# Patient Record
Sex: Female | Born: 1935 | Race: White | Hispanic: No | Smoking: Never smoker
Health system: Southern US, Community
[De-identification: ages and names within clinical notes are randomized; demographics above are authoritative.]

## PROBLEM LIST (undated history)

## (undated) DIAGNOSIS — I82409 Acute embolism and thrombosis of unspecified deep veins of unspecified lower extremity: Secondary | ICD-10-CM

## (undated) DIAGNOSIS — J189 Pneumonia, unspecified organism: Secondary | ICD-10-CM

## (undated) DIAGNOSIS — Z9889 Other specified postprocedural states: Secondary | ICD-10-CM

## (undated) DIAGNOSIS — T8859XA Other complications of anesthesia, initial encounter: Secondary | ICD-10-CM

## (undated) DIAGNOSIS — D759 Disease of blood and blood-forming organs, unspecified: Secondary | ICD-10-CM

## (undated) DIAGNOSIS — R112 Nausea with vomiting, unspecified: Secondary | ICD-10-CM

## (undated) DIAGNOSIS — H409 Unspecified glaucoma: Secondary | ICD-10-CM

## (undated) DIAGNOSIS — K219 Gastro-esophageal reflux disease without esophagitis: Secondary | ICD-10-CM

## (undated) DIAGNOSIS — I10 Essential (primary) hypertension: Secondary | ICD-10-CM

## (undated) DIAGNOSIS — M1990 Unspecified osteoarthritis, unspecified site: Secondary | ICD-10-CM

## (undated) DIAGNOSIS — J984 Other disorders of lung: Secondary | ICD-10-CM

## (undated) DIAGNOSIS — E119 Type 2 diabetes mellitus without complications: Secondary | ICD-10-CM

## (undated) DIAGNOSIS — J8281 Chronic eosinophilic pneumonia: Secondary | ICD-10-CM

## (undated) DIAGNOSIS — T4145XA Adverse effect of unspecified anesthetic, initial encounter: Secondary | ICD-10-CM

## (undated) DIAGNOSIS — J82 Pulmonary eosinophilia, not elsewhere classified: Secondary | ICD-10-CM

## (undated) HISTORY — PX: EYE SURGERY: SHX253

## (undated) HISTORY — PX: TUBAL LIGATION: SHX77

---

## 2010-11-13 ENCOUNTER — Other Ambulatory Visit: Payer: Self-pay | Admitting: Neurology

## 2010-11-13 DIAGNOSIS — M25559 Pain in unspecified hip: Secondary | ICD-10-CM

## 2010-11-17 ENCOUNTER — Ambulatory Visit
Admission: RE | Admit: 2010-11-17 | Discharge: 2010-11-17 | Disposition: A | Discharge status: Home / Self Care | Payer: Medicare Other | Source: Ambulatory Visit | Attending: Neurology | Admitting: Neurology

## 2010-11-17 DIAGNOSIS — M25559 Pain in unspecified hip: Secondary | ICD-10-CM

## 2011-05-29 DIAGNOSIS — H4011X0 Primary open-angle glaucoma, stage unspecified: Secondary | ICD-10-CM | POA: Diagnosis not present

## 2011-06-12 DIAGNOSIS — R609 Edema, unspecified: Secondary | ICD-10-CM | POA: Diagnosis not present

## 2011-06-12 DIAGNOSIS — S8010XA Contusion of unspecified lower leg, initial encounter: Secondary | ICD-10-CM | POA: Diagnosis not present

## 2011-06-12 DIAGNOSIS — R0602 Shortness of breath: Secondary | ICD-10-CM | POA: Diagnosis not present

## 2011-06-13 DIAGNOSIS — I509 Heart failure, unspecified: Secondary | ICD-10-CM | POA: Diagnosis not present

## 2011-06-13 DIAGNOSIS — I369 Nonrheumatic tricuspid valve disorder, unspecified: Secondary | ICD-10-CM | POA: Diagnosis not present

## 2011-06-13 DIAGNOSIS — I059 Rheumatic mitral valve disease, unspecified: Secondary | ICD-10-CM | POA: Diagnosis not present

## 2011-06-13 DIAGNOSIS — I079 Rheumatic tricuspid valve disease, unspecified: Secondary | ICD-10-CM | POA: Diagnosis not present

## 2011-06-13 DIAGNOSIS — I517 Cardiomegaly: Secondary | ICD-10-CM | POA: Diagnosis not present

## 2011-06-13 DIAGNOSIS — I359 Nonrheumatic aortic valve disorder, unspecified: Secondary | ICD-10-CM | POA: Diagnosis not present

## 2011-08-15 DIAGNOSIS — R0989 Other specified symptoms and signs involving the circulatory and respiratory systems: Secondary | ICD-10-CM | POA: Diagnosis not present

## 2011-08-15 DIAGNOSIS — R7309 Other abnormal glucose: Secondary | ICD-10-CM | POA: Diagnosis not present

## 2011-08-15 DIAGNOSIS — M545 Low back pain: Secondary | ICD-10-CM | POA: Diagnosis not present

## 2011-08-20 DIAGNOSIS — R0989 Other specified symptoms and signs involving the circulatory and respiratory systems: Secondary | ICD-10-CM | POA: Diagnosis not present

## 2011-08-20 DIAGNOSIS — I658 Occlusion and stenosis of other precerebral arteries: Secondary | ICD-10-CM | POA: Diagnosis not present

## 2011-08-20 DIAGNOSIS — I6529 Occlusion and stenosis of unspecified carotid artery: Secondary | ICD-10-CM | POA: Diagnosis not present

## 2011-08-28 DIAGNOSIS — E782 Mixed hyperlipidemia: Secondary | ICD-10-CM | POA: Diagnosis not present

## 2011-08-28 DIAGNOSIS — E119 Type 2 diabetes mellitus without complications: Secondary | ICD-10-CM | POA: Diagnosis not present

## 2011-09-05 DIAGNOSIS — E119 Type 2 diabetes mellitus without complications: Secondary | ICD-10-CM | POA: Diagnosis not present

## 2011-09-09 DIAGNOSIS — E119 Type 2 diabetes mellitus without complications: Secondary | ICD-10-CM | POA: Diagnosis not present

## 2011-09-09 DIAGNOSIS — Z713 Dietary counseling and surveillance: Secondary | ICD-10-CM | POA: Diagnosis not present

## 2011-09-19 DIAGNOSIS — E119 Type 2 diabetes mellitus without complications: Secondary | ICD-10-CM | POA: Diagnosis not present

## 2011-09-19 DIAGNOSIS — Z713 Dietary counseling and surveillance: Secondary | ICD-10-CM | POA: Diagnosis not present

## 2011-11-28 DIAGNOSIS — I700 Atherosclerosis of aorta: Secondary | ICD-10-CM | POA: Diagnosis not present

## 2011-11-28 DIAGNOSIS — R7309 Other abnormal glucose: Secondary | ICD-10-CM | POA: Diagnosis not present

## 2011-11-28 DIAGNOSIS — G609 Hereditary and idiopathic neuropathy, unspecified: Secondary | ICD-10-CM | POA: Diagnosis not present

## 2011-11-28 DIAGNOSIS — E782 Mixed hyperlipidemia: Secondary | ICD-10-CM | POA: Diagnosis not present

## 2011-11-28 DIAGNOSIS — Z23 Encounter for immunization: Secondary | ICD-10-CM | POA: Diagnosis not present

## 2011-12-02 DIAGNOSIS — H524 Presbyopia: Secondary | ICD-10-CM | POA: Diagnosis not present

## 2011-12-02 DIAGNOSIS — H4011X0 Primary open-angle glaucoma, stage unspecified: Secondary | ICD-10-CM | POA: Diagnosis not present

## 2011-12-15 DIAGNOSIS — M5137 Other intervertebral disc degeneration, lumbosacral region: Secondary | ICD-10-CM | POA: Diagnosis not present

## 2011-12-15 DIAGNOSIS — IMO0002 Reserved for concepts with insufficient information to code with codable children: Secondary | ICD-10-CM | POA: Diagnosis not present

## 2011-12-15 DIAGNOSIS — M79609 Pain in unspecified limb: Secondary | ICD-10-CM | POA: Diagnosis not present

## 2011-12-19 DIAGNOSIS — M6281 Muscle weakness (generalized): Secondary | ICD-10-CM | POA: Diagnosis not present

## 2011-12-19 DIAGNOSIS — R262 Difficulty in walking, not elsewhere classified: Secondary | ICD-10-CM | POA: Diagnosis not present

## 2011-12-19 DIAGNOSIS — M545 Low back pain: Secondary | ICD-10-CM | POA: Diagnosis not present

## 2011-12-19 DIAGNOSIS — R293 Abnormal posture: Secondary | ICD-10-CM | POA: Diagnosis not present

## 2011-12-23 DIAGNOSIS — R293 Abnormal posture: Secondary | ICD-10-CM | POA: Diagnosis not present

## 2011-12-23 DIAGNOSIS — M6281 Muscle weakness (generalized): Secondary | ICD-10-CM | POA: Diagnosis not present

## 2011-12-23 DIAGNOSIS — R262 Difficulty in walking, not elsewhere classified: Secondary | ICD-10-CM | POA: Diagnosis not present

## 2011-12-23 DIAGNOSIS — M545 Low back pain: Secondary | ICD-10-CM | POA: Diagnosis not present

## 2011-12-25 DIAGNOSIS — M6281 Muscle weakness (generalized): Secondary | ICD-10-CM | POA: Diagnosis not present

## 2011-12-25 DIAGNOSIS — R262 Difficulty in walking, not elsewhere classified: Secondary | ICD-10-CM | POA: Diagnosis not present

## 2011-12-25 DIAGNOSIS — M545 Low back pain: Secondary | ICD-10-CM | POA: Diagnosis not present

## 2011-12-25 DIAGNOSIS — R293 Abnormal posture: Secondary | ICD-10-CM | POA: Diagnosis not present

## 2011-12-30 DIAGNOSIS — M6281 Muscle weakness (generalized): Secondary | ICD-10-CM | POA: Diagnosis not present

## 2011-12-30 DIAGNOSIS — R262 Difficulty in walking, not elsewhere classified: Secondary | ICD-10-CM | POA: Diagnosis not present

## 2011-12-30 DIAGNOSIS — M545 Low back pain: Secondary | ICD-10-CM | POA: Diagnosis not present

## 2011-12-30 DIAGNOSIS — R293 Abnormal posture: Secondary | ICD-10-CM | POA: Diagnosis not present

## 2012-01-01 DIAGNOSIS — M545 Low back pain: Secondary | ICD-10-CM | POA: Diagnosis not present

## 2012-01-01 DIAGNOSIS — M6281 Muscle weakness (generalized): Secondary | ICD-10-CM | POA: Diagnosis not present

## 2012-01-01 DIAGNOSIS — R262 Difficulty in walking, not elsewhere classified: Secondary | ICD-10-CM | POA: Diagnosis not present

## 2012-01-01 DIAGNOSIS — R293 Abnormal posture: Secondary | ICD-10-CM | POA: Diagnosis not present

## 2012-01-06 DIAGNOSIS — M545 Low back pain: Secondary | ICD-10-CM | POA: Diagnosis not present

## 2012-01-06 DIAGNOSIS — R262 Difficulty in walking, not elsewhere classified: Secondary | ICD-10-CM | POA: Diagnosis not present

## 2012-01-06 DIAGNOSIS — R293 Abnormal posture: Secondary | ICD-10-CM | POA: Diagnosis not present

## 2012-01-06 DIAGNOSIS — M6281 Muscle weakness (generalized): Secondary | ICD-10-CM | POA: Diagnosis not present

## 2012-01-08 DIAGNOSIS — R262 Difficulty in walking, not elsewhere classified: Secondary | ICD-10-CM | POA: Diagnosis not present

## 2012-01-08 DIAGNOSIS — R293 Abnormal posture: Secondary | ICD-10-CM | POA: Diagnosis not present

## 2012-01-08 DIAGNOSIS — M6281 Muscle weakness (generalized): Secondary | ICD-10-CM | POA: Diagnosis not present

## 2012-01-08 DIAGNOSIS — M545 Low back pain: Secondary | ICD-10-CM | POA: Diagnosis not present

## 2012-01-12 DIAGNOSIS — R262 Difficulty in walking, not elsewhere classified: Secondary | ICD-10-CM | POA: Diagnosis not present

## 2012-01-12 DIAGNOSIS — M6281 Muscle weakness (generalized): Secondary | ICD-10-CM | POA: Diagnosis not present

## 2012-01-12 DIAGNOSIS — R293 Abnormal posture: Secondary | ICD-10-CM | POA: Diagnosis not present

## 2012-01-12 DIAGNOSIS — M545 Low back pain: Secondary | ICD-10-CM | POA: Diagnosis not present

## 2012-01-15 DIAGNOSIS — M545 Low back pain: Secondary | ICD-10-CM | POA: Diagnosis not present

## 2012-01-15 DIAGNOSIS — M6281 Muscle weakness (generalized): Secondary | ICD-10-CM | POA: Diagnosis not present

## 2012-01-15 DIAGNOSIS — R293 Abnormal posture: Secondary | ICD-10-CM | POA: Diagnosis not present

## 2012-01-15 DIAGNOSIS — R262 Difficulty in walking, not elsewhere classified: Secondary | ICD-10-CM | POA: Diagnosis not present

## 2012-01-20 DIAGNOSIS — R293 Abnormal posture: Secondary | ICD-10-CM | POA: Diagnosis not present

## 2012-01-20 DIAGNOSIS — R262 Difficulty in walking, not elsewhere classified: Secondary | ICD-10-CM | POA: Diagnosis not present

## 2012-01-20 DIAGNOSIS — M545 Low back pain: Secondary | ICD-10-CM | POA: Diagnosis not present

## 2012-01-20 DIAGNOSIS — M6281 Muscle weakness (generalized): Secondary | ICD-10-CM | POA: Diagnosis not present

## 2012-01-22 DIAGNOSIS — R293 Abnormal posture: Secondary | ICD-10-CM | POA: Diagnosis not present

## 2012-01-22 DIAGNOSIS — R262 Difficulty in walking, not elsewhere classified: Secondary | ICD-10-CM | POA: Diagnosis not present

## 2012-01-22 DIAGNOSIS — M6281 Muscle weakness (generalized): Secondary | ICD-10-CM | POA: Diagnosis not present

## 2012-01-22 DIAGNOSIS — M545 Low back pain: Secondary | ICD-10-CM | POA: Diagnosis not present

## 2012-01-28 DIAGNOSIS — R262 Difficulty in walking, not elsewhere classified: Secondary | ICD-10-CM | POA: Diagnosis not present

## 2012-01-28 DIAGNOSIS — R293 Abnormal posture: Secondary | ICD-10-CM | POA: Diagnosis not present

## 2012-01-28 DIAGNOSIS — M545 Low back pain: Secondary | ICD-10-CM | POA: Diagnosis not present

## 2012-01-28 DIAGNOSIS — M6281 Muscle weakness (generalized): Secondary | ICD-10-CM | POA: Diagnosis not present

## 2012-01-30 DIAGNOSIS — M6281 Muscle weakness (generalized): Secondary | ICD-10-CM | POA: Diagnosis not present

## 2012-01-30 DIAGNOSIS — R293 Abnormal posture: Secondary | ICD-10-CM | POA: Diagnosis not present

## 2012-01-30 DIAGNOSIS — R262 Difficulty in walking, not elsewhere classified: Secondary | ICD-10-CM | POA: Diagnosis not present

## 2012-01-30 DIAGNOSIS — M545 Low back pain: Secondary | ICD-10-CM | POA: Diagnosis not present

## 2012-02-03 DIAGNOSIS — R293 Abnormal posture: Secondary | ICD-10-CM | POA: Diagnosis not present

## 2012-02-03 DIAGNOSIS — R262 Difficulty in walking, not elsewhere classified: Secondary | ICD-10-CM | POA: Diagnosis not present

## 2012-02-03 DIAGNOSIS — M6281 Muscle weakness (generalized): Secondary | ICD-10-CM | POA: Diagnosis not present

## 2012-02-03 DIAGNOSIS — M545 Low back pain: Secondary | ICD-10-CM | POA: Diagnosis not present

## 2012-02-06 DIAGNOSIS — M545 Low back pain: Secondary | ICD-10-CM | POA: Diagnosis not present

## 2012-02-06 DIAGNOSIS — M6281 Muscle weakness (generalized): Secondary | ICD-10-CM | POA: Diagnosis not present

## 2012-02-06 DIAGNOSIS — R262 Difficulty in walking, not elsewhere classified: Secondary | ICD-10-CM | POA: Diagnosis not present

## 2012-02-06 DIAGNOSIS — R293 Abnormal posture: Secondary | ICD-10-CM | POA: Diagnosis not present

## 2012-02-09 DIAGNOSIS — IMO0002 Reserved for concepts with insufficient information to code with codable children: Secondary | ICD-10-CM | POA: Diagnosis not present

## 2012-02-09 DIAGNOSIS — M5137 Other intervertebral disc degeneration, lumbosacral region: Secondary | ICD-10-CM | POA: Diagnosis not present

## 2012-02-09 DIAGNOSIS — M79609 Pain in unspecified limb: Secondary | ICD-10-CM | POA: Diagnosis not present

## 2012-03-15 DIAGNOSIS — E782 Mixed hyperlipidemia: Secondary | ICD-10-CM | POA: Diagnosis not present

## 2012-03-15 DIAGNOSIS — Z79899 Other long term (current) drug therapy: Secondary | ICD-10-CM | POA: Diagnosis not present

## 2012-03-15 DIAGNOSIS — E119 Type 2 diabetes mellitus without complications: Secondary | ICD-10-CM | POA: Diagnosis not present

## 2012-03-24 DIAGNOSIS — E1149 Type 2 diabetes mellitus with other diabetic neurological complication: Secondary | ICD-10-CM | POA: Diagnosis not present

## 2012-06-21 DIAGNOSIS — S61209A Unspecified open wound of unspecified finger without damage to nail, initial encounter: Secondary | ICD-10-CM | POA: Diagnosis not present

## 2012-07-02 DIAGNOSIS — Z0000 Encounter for general adult medical examination without abnormal findings: Secondary | ICD-10-CM | POA: Diagnosis not present

## 2012-07-02 DIAGNOSIS — IMO0002 Reserved for concepts with insufficient information to code with codable children: Secondary | ICD-10-CM | POA: Diagnosis not present

## 2012-07-02 DIAGNOSIS — G609 Hereditary and idiopathic neuropathy, unspecified: Secondary | ICD-10-CM | POA: Diagnosis not present

## 2012-08-12 DIAGNOSIS — M25559 Pain in unspecified hip: Secondary | ICD-10-CM | POA: Diagnosis not present

## 2012-09-15 DIAGNOSIS — E782 Mixed hyperlipidemia: Secondary | ICD-10-CM | POA: Diagnosis not present

## 2012-09-15 DIAGNOSIS — Z79899 Other long term (current) drug therapy: Secondary | ICD-10-CM | POA: Diagnosis not present

## 2012-09-15 DIAGNOSIS — E119 Type 2 diabetes mellitus without complications: Secondary | ICD-10-CM | POA: Diagnosis not present

## 2012-09-30 DIAGNOSIS — E119 Type 2 diabetes mellitus without complications: Secondary | ICD-10-CM | POA: Diagnosis not present

## 2012-09-30 DIAGNOSIS — I10 Essential (primary) hypertension: Secondary | ICD-10-CM | POA: Diagnosis not present

## 2012-09-30 DIAGNOSIS — E782 Mixed hyperlipidemia: Secondary | ICD-10-CM | POA: Diagnosis not present

## 2012-10-04 DIAGNOSIS — W57XXXA Bitten or stung by nonvenomous insect and other nonvenomous arthropods, initial encounter: Secondary | ICD-10-CM | POA: Diagnosis not present

## 2012-10-04 DIAGNOSIS — S30860A Insect bite (nonvenomous) of lower back and pelvis, initial encounter: Secondary | ICD-10-CM | POA: Diagnosis not present

## 2012-10-13 DIAGNOSIS — H4011X0 Primary open-angle glaucoma, stage unspecified: Secondary | ICD-10-CM | POA: Diagnosis not present

## 2012-12-16 DIAGNOSIS — Z23 Encounter for immunization: Secondary | ICD-10-CM | POA: Diagnosis not present

## 2013-03-18 DIAGNOSIS — E7800 Pure hypercholesterolemia, unspecified: Secondary | ICD-10-CM | POA: Diagnosis not present

## 2013-03-18 DIAGNOSIS — Z79899 Other long term (current) drug therapy: Secondary | ICD-10-CM | POA: Diagnosis not present

## 2013-03-18 DIAGNOSIS — E119 Type 2 diabetes mellitus without complications: Secondary | ICD-10-CM | POA: Diagnosis not present

## 2013-03-25 DIAGNOSIS — E119 Type 2 diabetes mellitus without complications: Secondary | ICD-10-CM | POA: Diagnosis not present

## 2013-03-25 DIAGNOSIS — M810 Age-related osteoporosis without current pathological fracture: Secondary | ICD-10-CM | POA: Diagnosis not present

## 2013-04-29 DIAGNOSIS — M810 Age-related osteoporosis without current pathological fracture: Secondary | ICD-10-CM | POA: Diagnosis not present

## 2013-04-29 DIAGNOSIS — M5126 Other intervertebral disc displacement, lumbar region: Secondary | ICD-10-CM | POA: Diagnosis not present

## 2013-05-12 DIAGNOSIS — M47817 Spondylosis without myelopathy or radiculopathy, lumbosacral region: Secondary | ICD-10-CM | POA: Diagnosis not present

## 2013-05-12 DIAGNOSIS — M4310 Spondylolisthesis, site unspecified: Secondary | ICD-10-CM | POA: Diagnosis not present

## 2013-05-12 DIAGNOSIS — M48061 Spinal stenosis, lumbar region without neurogenic claudication: Secondary | ICD-10-CM | POA: Diagnosis not present

## 2013-05-12 DIAGNOSIS — S32009A Unspecified fracture of unspecified lumbar vertebra, initial encounter for closed fracture: Secondary | ICD-10-CM | POA: Diagnosis not present

## 2013-06-07 DIAGNOSIS — I10 Essential (primary) hypertension: Secondary | ICD-10-CM | POA: Diagnosis not present

## 2013-06-07 DIAGNOSIS — Z6832 Body mass index (BMI) 32.0-32.9, adult: Secondary | ICD-10-CM | POA: Diagnosis not present

## 2013-06-07 DIAGNOSIS — M48061 Spinal stenosis, lumbar region without neurogenic claudication: Secondary | ICD-10-CM | POA: Diagnosis not present

## 2013-06-07 DIAGNOSIS — M549 Dorsalgia, unspecified: Secondary | ICD-10-CM | POA: Diagnosis not present

## 2013-06-24 DIAGNOSIS — H4011X0 Primary open-angle glaucoma, stage unspecified: Secondary | ICD-10-CM | POA: Diagnosis not present

## 2013-07-27 ENCOUNTER — Other Ambulatory Visit: Payer: Self-pay | Admitting: Neurosurgery

## 2013-07-27 DIAGNOSIS — Q762 Congenital spondylolisthesis: Secondary | ICD-10-CM | POA: Diagnosis not present

## 2013-07-27 DIAGNOSIS — Z6832 Body mass index (BMI) 32.0-32.9, adult: Secondary | ICD-10-CM | POA: Diagnosis not present

## 2013-07-27 DIAGNOSIS — S32009A Unspecified fracture of unspecified lumbar vertebra, initial encounter for closed fracture: Secondary | ICD-10-CM | POA: Diagnosis not present

## 2013-07-27 DIAGNOSIS — M48061 Spinal stenosis, lumbar region without neurogenic claudication: Secondary | ICD-10-CM | POA: Diagnosis not present

## 2013-08-17 ENCOUNTER — Encounter (HOSPITAL_COMMUNITY): Payer: Self-pay | Admitting: Pharmacy Technician

## 2013-08-17 NOTE — Pre-Procedure Instructions (Signed)
Monica Evans  08/17/2013   Your procedure is scheduled on:  08/29/2013  Report to [facility]     ENTRANCE A at 5:30 AM.  Call this number if you have problems the morning of surgery: [phone]   Remember:   Do not eat food or drink liquids after midnight.  On SUNDAY   Take these medicines the morning of surgery with A SIP OF WATER:  Prednisone, omeprazole,  Pain medicine is OK if needed    Do not wear jewelry, make-up or nail polish.   Do not wear lotions, powders, or perfumes. You may wear deodorant.   Do not shave 48 hours prior to surgery.    Do not bring valuables to the hospital.  [facility] is not responsible for any belongings or valuables.                Contacts, dentures or bridgework may not be worn into surgery.   Leave suitcase in the car. After surgery it may be brought to your room.   For patients admitted to the hospital, discharge time is determined by your                treatment team.               Patients discharged the day of surgery will not be allowed to drive  home.  Name and phone number of your driver: with family  Special Instructions: Special Instructions: [place] - Preparing for Surgery  Before surgery, you can play an important role.  Because skin is not sterile, your skin needs to be as free of germs as possible.  You can reduce the number of germs on you skin by washing with CHG (chlorahexidine gluconate) soap before surgery.  CHG is an antiseptic cleaner which kills germs and bonds with the skin to continue killing germs even after washing.  Please DO NOT use if you have an allergy to CHG or antibacterial soaps.  If your skin becomes reddened/irritated stop using the CHG and inform your nurse when you arrive at Short Stay.  Do not shave (including legs and underarms) for at least 48 hours prior to the first CHG shower.  You may shave your face.  Please follow these instructions carefully:   1.  Shower with CHG  Soap the night before surgery and the  morning of Surgery.  2.  If you choose to wash your hair, wash your hair first as usual with your  normal shampoo.  3.  After you shampoo, rinse your hair and body thoroughly to remove the  Shampoo.  4.  Use CHG as you would any other liquid soap.  You can apply chg directly to the skin and wash gently with scrungie or a clean washcloth.  5.  Apply the CHG Soap to your body ONLY FROM THE NECK DOWN.    Do not use on open wounds or open sores.  Avoid contact with your eyes, ears, mouth and genitals (private parts).  Wash genitals (private parts)   with your normal soap.  6.  Wash thoroughly, paying special attention to the area where your surgery will be performed.  7.  Thoroughly rinse your body with warm water from the neck down.  8.  DO NOT shower/wash with your normal soap after using and rinsing off   the CHG Soap.  9.  Pat yourself dry with a clean towel.  10.  Wear clean pajamas.            11.  Place clean sheets on your bed the night of your first shower and do not sleep with pets.  Day of Surgery  Do not apply any lotions/deodorants the morning of surgery.  Please wear clean clothes to the hospital/surgery center.   Please read over the following fact sheets that you were given: Pain Booklet, Coughing and Deep Breathing, Blood Transfusion Information, MRSA Information and Surgical Site Infection Prevention

## 2013-08-18 ENCOUNTER — Ambulatory Visit (HOSPITAL_COMMUNITY)
Admission: RE | Admit: 2013-08-18 | Discharge: 2013-08-18 | Disposition: A | Discharge status: Home / Self Care | Payer: Medicare Other | Source: Ambulatory Visit | Attending: Neurosurgery | Admitting: Neurosurgery

## 2013-08-18 ENCOUNTER — Encounter (HOSPITAL_COMMUNITY)
Admission: RE | Admit: 2013-08-18 | Discharge: 2013-08-18 | Disposition: A | Discharge status: Home / Self Care | Payer: Medicare Other | Source: Ambulatory Visit | Attending: Neurosurgery | Admitting: Neurosurgery

## 2013-08-18 ENCOUNTER — Encounter (HOSPITAL_COMMUNITY): Payer: Self-pay

## 2013-08-18 DIAGNOSIS — Z01812 Encounter for preprocedural laboratory examination: Secondary | ICD-10-CM | POA: Insufficient documentation

## 2013-08-18 DIAGNOSIS — Z01810 Encounter for preprocedural cardiovascular examination: Secondary | ICD-10-CM | POA: Diagnosis not present

## 2013-08-18 DIAGNOSIS — Z01818 Encounter for other preprocedural examination: Secondary | ICD-10-CM | POA: Insufficient documentation

## 2013-08-18 HISTORY — DX: Adverse effect of unspecified anesthetic, initial encounter: T41.45XA

## 2013-08-18 HISTORY — DX: Pneumonia, unspecified organism: J18.9

## 2013-08-18 HISTORY — DX: Other specified postprocedural states: Z98.890

## 2013-08-18 HISTORY — DX: Essential (primary) hypertension: I10

## 2013-08-18 HISTORY — DX: Nausea with vomiting, unspecified: R11.2

## 2013-08-18 HISTORY — DX: Chronic eosinophilic pneumonia: J82.81

## 2013-08-18 HISTORY — DX: Disease of blood and blood-forming organs, unspecified: D75.9

## 2013-08-18 HISTORY — DX: Gastro-esophageal reflux disease without esophagitis: K21.9

## 2013-08-18 HISTORY — DX: Unspecified osteoarthritis, unspecified site: M19.90

## 2013-08-18 HISTORY — DX: Pulmonary eosinophilia, not elsewhere classified: J82

## 2013-08-18 HISTORY — DX: Other disorders of lung: J98.4

## 2013-08-18 HISTORY — DX: Unspecified glaucoma: H40.9

## 2013-08-18 HISTORY — DX: Other complications of anesthesia, initial encounter: T88.59XA

## 2013-08-18 HISTORY — DX: Type 2 diabetes mellitus without complications: E11.9

## 2013-08-18 LAB — BASIC METABOLIC PANEL
BUN: 20 mg/dL (ref 6–23)
CO2: 31 mEq/L (ref 19–32)
Calcium: 9.5 mg/dL (ref 8.4–10.5)
Chloride: 100 mEq/L (ref 96–112)
Creatinine, Ser: 1.14 mg/dL — ABNORMAL HIGH (ref 0.50–1.10)
GFR calc Af Amer: 52 mL/min — ABNORMAL LOW (ref >90)
GFR, EST NON AFRICAN AMERICAN: 45 mL/min — AB (ref >90)
Glucose, Bld: 146 mg/dL — ABNORMAL HIGH (ref 70–99)
Potassium: 4.8 mEq/L (ref 3.7–5.3)
Sodium: 141 mEq/L (ref 137–147)

## 2013-08-18 LAB — TYPE AND SCREEN
ABO/RH(D): O POS
Antibody Screen: NEGATIVE

## 2013-08-18 LAB — SURGICAL PCR SCREEN
MRSA, PCR: NEGATIVE
Staphylococcus aureus: NEGATIVE

## 2013-08-18 LAB — CBC
HCT: 41.8 % (ref 36.0–46.0)
Hemoglobin: 13.6 g/dL (ref 12.0–15.0)
MCH: 33.7 pg (ref 26.0–34.0)
MCHC: 32.5 g/dL (ref 30.0–36.0)
MCV: 103.7 fL — ABNORMAL HIGH (ref 78.0–100.0)
PLATELETS: 285 10*3/uL (ref 150–400)
RBC: 4.03 MIL/uL (ref 3.87–5.11)
RDW: 13.3 % (ref 11.5–15.5)
WBC: 13.4 10*3/uL — ABNORMAL HIGH (ref 4.0–10.5)

## 2013-08-18 LAB — ABO/RH: ABO/RH(D): O POS

## 2013-08-18 NOTE — Progress Notes (Signed)
Due to machine interpretation on EKG, requested EKG from PCP office.

## 2013-08-18 NOTE — Progress Notes (Signed)
Stress test done > 27yrs. Ago at [facility] told that it was normal. Pt. also had EKG & CXR at some point during 2014- /w PCP , Dr. Nicki Reaper - [facility].

## 2013-08-19 ENCOUNTER — Other Ambulatory Visit (HOSPITAL_COMMUNITY): Payer: No Typology Code available for payment source

## 2013-08-19 NOTE — Progress Notes (Signed)
Anesthesia Chart Review:  Patient is a 79 year old female scheduled for L3-4, L4-5 PLIF on 08/29/13 by Dr. Arnoldo Morale.   History includes pre-DM2 (no medications), HTN, GERD, eosinophilic PNA with apical lung scarring at age 56, glaucoma, arthritis, post-operative N/V. BMI is consistent with obesity. Smoking status was not documented at her PAT visit.   PCP is Dr. Nicki Reaper with [facility].    CXR on 08/18/13 showed: Probable chronic mild interstitial prominence without convincing pulmonary edema. Streaky left base retrocardiac atelectasis or infiltrate. Prior vertebroplasty at L2 level.       Preoperative labs noted.  EKG on 08/18/13 showed NSR, cannot rule out anterior infarct (age undetermined). Currently there is no comparison EKG in Muse or Epic. A comparison EKG was requested from [place] and [facility] as available.  Short Stay RN staff to follow-up and have one of the anesthesiologists review if received.  Otherwise, clinical correlation on the day of surgery. No CV symptoms were documented at her PAT visit.  George Hugh [facility] Short Stay Center/Anesthesiology Phone [phone] 08/19/2013 6:56 PM

## 2013-08-23 NOTE — Progress Notes (Signed)
Anesthesiology Chart Review:  ECG, Chest Xray  Reviewed both appear acceptable for surgery. Patient had (-) nuclear stress test 08/17/2001 and moderate LVH but normal LV systolic function by ECHO on 06/13/2011.   She will be evaluated by her assigned anesthesiologist on the day of surgery.  Roberts Gaudy

## 2013-08-28 MED ORDER — CEFAZOLIN SODIUM-DEXTROSE 2-3 GM-% IV SOLR
2.0000 g | INTRAVENOUS | Status: AC
Start: 2013-08-29 — End: 2013-08-29
  Administered 2013-08-29: 2 g via INTRAVENOUS
  Filled 2013-08-28: qty 50

## 2013-08-29 ENCOUNTER — Inpatient Hospital Stay (HOSPITAL_COMMUNITY): Payer: Medicare Other | Admitting: Anesthesiology

## 2013-08-29 ENCOUNTER — Inpatient Hospital Stay (HOSPITAL_COMMUNITY)
Admission: RE | Admit: 2013-08-29 | Discharge: 2013-09-02 | DRG: 460 | Disposition: A | Discharge status: Skilled Nursing Facility | Payer: Medicare Other | Source: Ambulatory Visit | Attending: Neurosurgery | Admitting: Neurosurgery

## 2013-08-29 ENCOUNTER — Encounter (HOSPITAL_COMMUNITY): Payer: Medicare Other | Admitting: Vascular Surgery

## 2013-08-29 ENCOUNTER — Inpatient Hospital Stay (HOSPITAL_COMMUNITY): Payer: Medicare Other

## 2013-08-29 ENCOUNTER — Encounter (HOSPITAL_COMMUNITY): Payer: Self-pay | Admitting: *Deleted

## 2013-08-29 ENCOUNTER — Encounter (HOSPITAL_COMMUNITY)
Admission: RE | Disposition: A | Discharge status: Skilled Nursing Facility | Payer: No Typology Code available for payment source | Source: Ambulatory Visit | Attending: Neurosurgery

## 2013-08-29 DIAGNOSIS — M48061 Spinal stenosis, lumbar region without neurogenic claudication: Secondary | ICD-10-CM | POA: Diagnosis not present

## 2013-08-29 DIAGNOSIS — Z79899 Other long term (current) drug therapy: Secondary | ICD-10-CM | POA: Diagnosis not present

## 2013-08-29 DIAGNOSIS — Q7649 Other congenital malformations of spine, not associated with scoliosis: Secondary | ICD-10-CM | POA: Diagnosis not present

## 2013-08-29 DIAGNOSIS — Z880 Allergy status to penicillin: Secondary | ICD-10-CM

## 2013-08-29 DIAGNOSIS — IMO0002 Reserved for concepts with insufficient information to code with codable children: Secondary | ICD-10-CM | POA: Diagnosis not present

## 2013-08-29 DIAGNOSIS — M545 Low back pain, unspecified: Secondary | ICD-10-CM | POA: Diagnosis not present

## 2013-08-29 DIAGNOSIS — M48062 Spinal stenosis, lumbar region with neurogenic claudication: Secondary | ICD-10-CM | POA: Diagnosis not present

## 2013-08-29 DIAGNOSIS — M4310 Spondylolisthesis, site unspecified: Secondary | ICD-10-CM | POA: Diagnosis not present

## 2013-08-29 DIAGNOSIS — S32009A Unspecified fracture of unspecified lumbar vertebra, initial encounter for closed fracture: Secondary | ICD-10-CM | POA: Diagnosis not present

## 2013-08-29 DIAGNOSIS — E119 Type 2 diabetes mellitus without complications: Secondary | ICD-10-CM | POA: Diagnosis present

## 2013-08-29 DIAGNOSIS — Z7982 Long term (current) use of aspirin: Secondary | ICD-10-CM

## 2013-08-29 DIAGNOSIS — K219 Gastro-esophageal reflux disease without esophagitis: Secondary | ICD-10-CM | POA: Diagnosis present

## 2013-08-29 DIAGNOSIS — Z9851 Tubal ligation status: Secondary | ICD-10-CM

## 2013-08-29 DIAGNOSIS — S32001A Stable burst fracture of unspecified lumbar vertebra, initial encounter for closed fracture: Secondary | ICD-10-CM

## 2013-08-29 DIAGNOSIS — I10 Essential (primary) hypertension: Secondary | ICD-10-CM | POA: Diagnosis present

## 2013-08-29 DIAGNOSIS — X58XXXA Exposure to other specified factors, initial encounter: Secondary | ICD-10-CM | POA: Diagnosis present

## 2013-08-29 DIAGNOSIS — M4800 Spinal stenosis, site unspecified: Secondary | ICD-10-CM | POA: Diagnosis not present

## 2013-08-29 DIAGNOSIS — M539 Dorsopathy, unspecified: Secondary | ICD-10-CM | POA: Diagnosis not present

## 2013-08-29 DIAGNOSIS — H409 Unspecified glaucoma: Secondary | ICD-10-CM | POA: Diagnosis present

## 2013-08-29 DIAGNOSIS — Q762 Congenital spondylolisthesis: Secondary | ICD-10-CM | POA: Diagnosis not present

## 2013-08-29 DIAGNOSIS — S32001K Stable burst fracture of unspecified lumbar vertebra, subsequent encounter for fracture with nonunion: Secondary | ICD-10-CM

## 2013-08-29 HISTORY — PX: POSTERIOR FUSION LUMBAR SPINE: SUR632

## 2013-08-29 SURGERY — POSTERIOR LUMBAR FUSION 2 LEVEL
Anesthesia: General

## 2013-08-29 MED ORDER — BUPIVACAINE LIPOSOME 1.3 % IJ SUSP
INTRAMUSCULAR | Status: DC | PRN
  Administered 2013-08-29: 20 mL

## 2013-08-29 MED ORDER — GLYCOPYRROLATE 0.2 MG/ML IJ SOLN
INTRAMUSCULAR | Status: DC | PRN
  Administered 2013-08-29: 0.4 mg via INTRAVENOUS

## 2013-08-29 MED ORDER — FENTANYL CITRATE 0.05 MG/ML IJ SOLN
INTRAMUSCULAR | Status: DC | PRN
  Administered 2013-08-29 (×2): 50 ug via INTRAVENOUS
  Administered 2013-08-29: 150 ug via INTRAVENOUS

## 2013-08-29 MED ORDER — THROMBIN 20000 UNITS EX SOLR
CUTANEOUS | Status: DC | PRN
  Administered 2013-08-29: 09:00:00 via TOPICAL

## 2013-08-29 MED ORDER — EPHEDRINE SULFATE 50 MG/ML IJ SOLN
INTRAMUSCULAR | Status: AC
  Filled 2013-08-29: qty 1

## 2013-08-29 MED ORDER — HYDROCORTISONE NA SUCCINATE PF 100 MG IJ SOLR
50.0000 mg | Freq: Four times a day (QID) | INTRAMUSCULAR | Status: AC
  Administered 2013-08-29 – 2013-08-31 (×8): 50 mg via INTRAVENOUS
  Filled 2013-08-29: qty 1
  Filled 2013-08-29 (×2): qty 2
  Filled 2013-08-29: qty 1
  Filled 2013-08-29: qty 2
  Filled 2013-08-29 (×4): qty 1
  Filled 2013-08-29: qty 2

## 2013-08-29 MED ORDER — MIDAZOLAM HCL 2 MG/2ML IJ SOLN
INTRAMUSCULAR | Status: AC
  Filled 2013-08-29: qty 2

## 2013-08-29 MED ORDER — ONDANSETRON HCL 4 MG/2ML IJ SOLN
4.0000 mg | INTRAMUSCULAR | Status: DC | PRN
  Administered 2013-08-29: 4 mg via INTRAVENOUS
  Filled 2013-08-29: qty 2

## 2013-08-29 MED ORDER — VERAPAMIL HCL ER 240 MG PO TBCR
240.0000 mg | EXTENDED_RELEASE_TABLET | Freq: Every day | ORAL | Status: DC
  Administered 2013-08-30 – 2013-09-01 (×3): 240 mg via ORAL
  Filled 2013-08-29 (×5): qty 1

## 2013-08-29 MED ORDER — BACITRACIN ZINC 500 UNIT/GM EX OINT
TOPICAL_OINTMENT | CUTANEOUS | Status: DC | PRN
  Administered 2013-08-29: 1 via TOPICAL

## 2013-08-29 MED ORDER — NEOSTIGMINE METHYLSULFATE 10 MG/10ML IV SOLN
INTRAVENOUS | Status: DC | PRN
  Administered 2013-08-29: 3 mg via INTRAVENOUS

## 2013-08-29 MED ORDER — DIAZEPAM 5 MG PO TABS
5.0000 mg | ORAL_TABLET | Freq: Four times a day (QID) | ORAL | Status: DC | PRN
  Administered 2013-08-29 – 2013-09-02 (×11): 5 mg via ORAL
  Filled 2013-08-29 (×11): qty 1

## 2013-08-29 MED ORDER — PROPOFOL 10 MG/ML IV BOLUS
INTRAVENOUS | Status: DC | PRN
  Administered 2013-08-29: 130 mg via INTRAVENOUS

## 2013-08-29 MED ORDER — FENTANYL CITRATE 0.05 MG/ML IJ SOLN
25.0000 ug | INTRAMUSCULAR | Status: DC | PRN
  Administered 2013-08-29: 25 ug via INTRAVENOUS
  Administered 2013-08-29: 50 ug via INTRAVENOUS
  Administered 2013-08-29: 25 ug via INTRAVENOUS

## 2013-08-29 MED ORDER — TIMOLOL MALEATE 0.5 % OP SOLN
1.0000 [drp] | Freq: Two times a day (BID) | OPHTHALMIC | Status: DC
  Administered 2013-08-29 – 2013-09-02 (×8): 1 [drp] via OPHTHALMIC
  Filled 2013-08-29: qty 5

## 2013-08-29 MED ORDER — SUCCINYLCHOLINE CHLORIDE 20 MG/ML IJ SOLN
INTRAMUSCULAR | Status: AC
  Filled 2013-08-29: qty 1

## 2013-08-29 MED ORDER — HYDROCODONE-ACETAMINOPHEN 5-325 MG PO TABS
1.0000 | ORAL_TABLET | ORAL | Status: DC | PRN
  Administered 2013-08-30: 1 via ORAL
  Administered 2013-08-30: 2 via ORAL
  Administered 2013-08-30 (×2): 1 via ORAL
  Administered 2013-08-31: 2 via ORAL
  Administered 2013-08-31 (×2): 1 via ORAL
  Administered 2013-09-01 – 2013-09-02 (×6): 2 via ORAL
  Filled 2013-08-29: qty 1
  Filled 2013-08-29: qty 2
  Filled 2013-08-29: qty 1
  Filled 2013-08-29 (×4): qty 2
  Filled 2013-08-29: qty 1
  Filled 2013-08-29: qty 2
  Filled 2013-08-29: qty 1
  Filled 2013-08-29 (×3): qty 2

## 2013-08-29 MED ORDER — CEFAZOLIN SODIUM-DEXTROSE 2-3 GM-% IV SOLR
2.0000 g | Freq: Three times a day (TID) | INTRAVENOUS | Status: AC
  Administered 2013-08-29 – 2013-08-30 (×2): 2 g via INTRAVENOUS
  Filled 2013-08-29 (×2): qty 50

## 2013-08-29 MED ORDER — FENTANYL CITRATE 0.05 MG/ML IJ SOLN
INTRAMUSCULAR | Status: AC
  Filled 2013-08-29: qty 2

## 2013-08-29 MED ORDER — FENTANYL CITRATE 0.05 MG/ML IJ SOLN
INTRAMUSCULAR | Status: AC
  Filled 2013-08-29: qty 5

## 2013-08-29 MED ORDER — DOCUSATE SODIUM 100 MG PO CAPS
100.0000 mg | ORAL_CAPSULE | Freq: Two times a day (BID) | ORAL | Status: DC
  Administered 2013-08-29 – 2013-09-02 (×8): 100 mg via ORAL
  Filled 2013-08-29 (×6): qty 1

## 2013-08-29 MED ORDER — SODIUM CHLORIDE 0.9 % IJ SOLN
INTRAMUSCULAR | Status: AC
  Filled 2013-08-29: qty 10

## 2013-08-29 MED ORDER — GLYCOPYRROLATE 0.2 MG/ML IJ SOLN
INTRAMUSCULAR | Status: AC
  Filled 2013-08-29: qty 2

## 2013-08-29 MED ORDER — LACTATED RINGERS IV SOLN
INTRAVENOUS | Status: DC
  Administered 2013-08-29 – 2013-08-30 (×2): via INTRAVENOUS

## 2013-08-29 MED ORDER — SODIUM CHLORIDE 0.9 % IV SOLN
10.0000 mg | INTRAVENOUS | Status: DC | PRN
  Administered 2013-08-29: 10 ug/min via INTRAVENOUS

## 2013-08-29 MED ORDER — LIDOCAINE HCL (CARDIAC) 20 MG/ML IV SOLN
INTRAVENOUS | Status: DC | PRN
  Administered 2013-08-29: 60 mg via INTRAVENOUS

## 2013-08-29 MED ORDER — NEOSTIGMINE METHYLSULFATE 10 MG/10ML IV SOLN
INTRAVENOUS | Status: AC
  Filled 2013-08-29: qty 1

## 2013-08-29 MED ORDER — 0.9 % SODIUM CHLORIDE (POUR BTL) OPTIME
TOPICAL | Status: DC | PRN
  Administered 2013-08-29: 1000 mL

## 2013-08-29 MED ORDER — ARTIFICIAL TEARS OP OINT
TOPICAL_OINTMENT | OPHTHALMIC | Status: AC
  Filled 2013-08-29: qty 3.5

## 2013-08-29 MED ORDER — DEXAMETHASONE SODIUM PHOSPHATE 4 MG/ML IJ SOLN
INTRAMUSCULAR | Status: DC | PRN
  Administered 2013-08-29: 4 mg via INTRAVENOUS

## 2013-08-29 MED ORDER — ONDANSETRON HCL 4 MG/2ML IJ SOLN
INTRAMUSCULAR | Status: AC
  Filled 2013-08-29: qty 2

## 2013-08-29 MED ORDER — ALUM & MAG HYDROXIDE-SIMETH 200-200-20 MG/5ML PO SUSP
30.0000 mL | Freq: Four times a day (QID) | ORAL | Status: DC | PRN
Start: 2013-08-29 — End: 2013-09-02

## 2013-08-29 MED ORDER — BUPIVACAINE LIPOSOME 1.3 % IJ SUSP
20.0000 mL | Freq: Once | INTRAMUSCULAR | Status: AC
  Filled 2013-08-29: qty 20

## 2013-08-29 MED ORDER — PREDNISONE 10 MG PO TABS
10.0000 mg | ORAL_TABLET | Freq: Every day | ORAL | Status: DC
  Administered 2013-08-31 – 2013-09-02 (×3): 10 mg via ORAL
  Filled 2013-08-29 (×4): qty 1
  Filled 2013-08-29: qty 2

## 2013-08-29 MED ORDER — LACTATED RINGERS IV SOLN
INTRAVENOUS | Status: DC | PRN
  Administered 2013-08-29 (×2): via INTRAVENOUS

## 2013-08-29 MED ORDER — TIMOLOL HEMIHYDRATE 0.5 % OP SOLN: 1.0000 [drp] | Freq: Two times a day (BID) | OPHTHALMIC | Status: DC

## 2013-08-29 MED ORDER — ROCURONIUM BROMIDE 100 MG/10ML IV SOLN
INTRAVENOUS | Status: DC | PRN
  Administered 2013-08-29: 50 mg via INTRAVENOUS

## 2013-08-29 MED ORDER — MENTHOL 3 MG MT LOZG: 1.0000 | LOZENGE | OROMUCOSAL | Status: DC | PRN

## 2013-08-29 MED ORDER — PROPOFOL 10 MG/ML IV BOLUS
INTRAVENOUS | Status: AC
  Filled 2013-08-29: qty 20

## 2013-08-29 MED ORDER — ACETAMINOPHEN 325 MG PO TABS
650.0000 mg | ORAL_TABLET | ORAL | Status: DC | PRN
  Filled 2013-08-29: qty 2

## 2013-08-29 MED ORDER — EPHEDRINE SULFATE 50 MG/ML IJ SOLN
INTRAMUSCULAR | Status: DC | PRN
  Administered 2013-08-29 (×2): 10 mg via INTRAVENOUS

## 2013-08-29 MED ORDER — LIDOCAINE HCL (CARDIAC) 20 MG/ML IV SOLN
INTRAVENOUS | Status: AC
  Filled 2013-08-29: qty 5

## 2013-08-29 MED ORDER — FUROSEMIDE 80 MG PO TABS
80.0000 mg | ORAL_TABLET | Freq: Every evening | ORAL | Status: DC
  Administered 2013-08-29: 80 mg via ORAL
  Filled 2013-08-29 (×2): qty 1

## 2013-08-29 MED ORDER — MORPHINE SULFATE 2 MG/ML IJ SOLN
1.0000 mg | INTRAMUSCULAR | Status: DC | PRN
  Administered 2013-08-29: 4 mg via INTRAVENOUS
  Administered 2013-08-31 – 2013-09-01 (×5): 2 mg via INTRAVENOUS
  Administered 2013-09-02: 4 mg via INTRAVENOUS
  Filled 2013-08-29 (×3): qty 1
  Filled 2013-08-29: qty 2
  Filled 2013-08-29: qty 1
  Filled 2013-08-29: qty 2
  Filled 2013-08-29: qty 1

## 2013-08-29 MED ORDER — PANTOPRAZOLE SODIUM 40 MG PO TBEC
40.0000 mg | DELAYED_RELEASE_TABLET | Freq: Every day | ORAL | Status: DC
  Administered 2013-08-30 – 2013-09-02 (×4): 40 mg via ORAL
  Filled 2013-08-29 (×3): qty 1

## 2013-08-29 MED ORDER — POTASSIUM CHLORIDE CRYS ER 10 MEQ PO TBCR
10.0000 meq | EXTENDED_RELEASE_TABLET | Freq: Every day | ORAL | Status: DC
  Administered 2013-08-30 – 2013-09-02 (×4): 10 meq via ORAL
  Filled 2013-08-29 (×6): qty 1

## 2013-08-29 MED ORDER — ROCURONIUM BROMIDE 50 MG/5ML IV SOLN
INTRAVENOUS | Status: AC
  Filled 2013-08-29: qty 1

## 2013-08-29 MED ORDER — ONDANSETRON HCL 4 MG/2ML IJ SOLN
INTRAMUSCULAR | Status: DC | PRN
  Administered 2013-08-29: 4 mg via INTRAVENOUS

## 2013-08-29 MED ORDER — OXYCODONE-ACETAMINOPHEN 5-325 MG PO TABS
1.0000 | ORAL_TABLET | ORAL | Status: DC | PRN
  Administered 2013-08-29 – 2013-09-02 (×9): 2 via ORAL
  Filled 2013-08-29 (×9): qty 2

## 2013-08-29 MED ORDER — PHENOL 1.4 % MT LIQD: 1.0000 | OROMUCOSAL | Status: DC | PRN

## 2013-08-29 MED ORDER — SODIUM CHLORIDE 0.9 % IR SOLN
Status: DC | PRN
  Administered 2013-08-29: 09:00:00

## 2013-08-29 MED ORDER — BUPIVACAINE-EPINEPHRINE (PF) 0.5% -1:200000 IJ SOLN
INTRAMUSCULAR | Status: DC | PRN
  Administered 2013-08-29: 10 mL via PERINEURAL

## 2013-08-29 MED ORDER — ACETAMINOPHEN 650 MG RE SUPP: 650.0000 mg | RECTAL | Status: DC | PRN

## 2013-08-29 SURGICAL SUPPLY — 73 items
BAG DECANTER FOR FLEXI CONT (MISCELLANEOUS) ×2 IMPLANT
BENZOIN TINCTURE PRP APPL 2/3 (GAUZE/BANDAGES/DRESSINGS) ×2 IMPLANT
BLADE 10 SAFETY STRL DISP (BLADE) ×2 IMPLANT
BLADE SURG ROTATE 9660 (MISCELLANEOUS) IMPLANT
BRUSH SCRUB EZ PLAIN DRY (MISCELLANEOUS) ×2 IMPLANT
BUR ACORN 6.0 (BURR) ×2 IMPLANT
BUR MATCHSTICK NEURO 3.0 LAGG (BURR) ×2 IMPLANT
CANISTER SUCT 3000ML (MISCELLANEOUS) ×2 IMPLANT
CAP REVERE LOCKING (Cap) ×8 IMPLANT
CONN CROSSLINK REV 6.35 48-60 (Connector) ×2 IMPLANT
CONNECTOR CRSLNK REV6.35 48-60 (Connector) ×1 IMPLANT
CONT SPEC 4OZ CLIKSEAL STRL BL (MISCELLANEOUS) ×4 IMPLANT
COVER BACK TABLE 24X17X13 BIG (DRAPES) IMPLANT
COVER TABLE BACK 60X90 (DRAPES) ×2 IMPLANT
DRAPE C-ARM 42X72 X-RAY (DRAPES) ×4 IMPLANT
DRAPE LAPAROTOMY 100X72X124 (DRAPES) ×2 IMPLANT
DRAPE POUCH INSTRU U-SHP 10X18 (DRAPES) ×2 IMPLANT
DRAPE PROXIMA HALF (DRAPES) ×2 IMPLANT
DRAPE SURG 17X23 STRL (DRAPES) ×8 IMPLANT
ELECT BLADE 4.0 EZ CLEAN MEGAD (MISCELLANEOUS) ×2
ELECT REM PT RETURN 9FT ADLT (ELECTROSURGICAL) ×2
ELECTRODE BLDE 4.0 EZ CLN MEGD (MISCELLANEOUS) ×1 IMPLANT
ELECTRODE REM PT RTRN 9FT ADLT (ELECTROSURGICAL) ×1 IMPLANT
EVACUATOR 1/8 PVC DRAIN (DRAIN) ×2 IMPLANT
GAUZE SPONGE 4X4 16PLY XRAY LF (GAUZE/BANDAGES/DRESSINGS) ×2 IMPLANT
GLOVE BIO SURGEON STRL SZ8 (GLOVE) ×4 IMPLANT
GLOVE BIO SURGEON STRL SZ8.5 (GLOVE) ×4 IMPLANT
GLOVE BIOGEL PI IND STRL 7.5 (GLOVE) ×1 IMPLANT
GLOVE BIOGEL PI IND STRL 8.5 (GLOVE) ×2 IMPLANT
GLOVE BIOGEL PI INDICATOR 7.5 (GLOVE) ×1
GLOVE BIOGEL PI INDICATOR 8.5 (GLOVE) ×2
GLOVE EXAM NITRILE LRG STRL (GLOVE) IMPLANT
GLOVE EXAM NITRILE MD LF STRL (GLOVE) IMPLANT
GLOVE EXAM NITRILE XL STR (GLOVE) IMPLANT
GLOVE EXAM NITRILE XS STR PU (GLOVE) IMPLANT
GLOVE INDICATOR 8.5 STRL (GLOVE) ×2 IMPLANT
GLOVE SS BIOGEL STRL SZ 8 (GLOVE) ×2 IMPLANT
GLOVE SS BIOGEL STRL SZ 8.5 (GLOVE) ×1 IMPLANT
GLOVE SUPERSENSE BIOGEL SZ 8 (GLOVE) ×2
GLOVE SUPERSENSE BIOGEL SZ 8.5 (GLOVE) ×1
GLOVE SURG SS PI 7.0 STRL IVOR (GLOVE) ×6 IMPLANT
GOWN STRL REUS W/ TWL LRG LVL3 (GOWN DISPOSABLE) ×1 IMPLANT
GOWN STRL REUS W/ TWL XL LVL3 (GOWN DISPOSABLE) ×5 IMPLANT
GOWN STRL REUS W/TWL 2XL LVL3 (GOWN DISPOSABLE) IMPLANT
GOWN STRL REUS W/TWL LRG LVL3 (GOWN DISPOSABLE) ×1
GOWN STRL REUS W/TWL XL LVL3 (GOWN DISPOSABLE) ×5
KIT BASIN OR (CUSTOM PROCEDURE TRAY) ×2 IMPLANT
KIT INFUSE X SMALL 1.4CC (Orthopedic Implant) ×2 IMPLANT
KIT ROOM TURNOVER OR (KITS) ×2 IMPLANT
NEEDLE HYPO 21X1.5 SAFETY (NEEDLE) ×2 IMPLANT
NEEDLE HYPO 22GX1.5 SAFETY (NEEDLE) ×2 IMPLANT
NS IRRIG 1000ML POUR BTL (IV SOLUTION) ×2 IMPLANT
PACK FOAM VITOSS 10CC (Orthopedic Implant) ×2 IMPLANT
PACK LAMINECTOMY NEURO (CUSTOM PROCEDURE TRAY) ×2 IMPLANT
PAD ARMBOARD 7.5X6 YLW CONV (MISCELLANEOUS) ×6 IMPLANT
PATTIES SURGICAL .5 X1 (DISPOSABLE) IMPLANT
ROD REVERE 6.35 CURVED 60MM (Rod) ×4 IMPLANT
SCREW 7.5X50MM (Screw) ×2 IMPLANT
SPONGE GAUZE 4X4 12PLY (GAUZE/BANDAGES/DRESSINGS) ×2 IMPLANT
SPONGE LAP 4X18 X RAY DECT (DISPOSABLE) IMPLANT
SPONGE NEURO XRAY DETECT 1X3 (DISPOSABLE) IMPLANT
SPONGE SURGIFOAM ABS GEL 100 (HEMOSTASIS) ×2 IMPLANT
STRIP CLOSURE SKIN 1/2X4 (GAUZE/BANDAGES/DRESSINGS) ×2 IMPLANT
SUT VIC AB 1 CT1 18XBRD ANBCTR (SUTURE) ×2 IMPLANT
SUT VIC AB 1 CT1 8-18 (SUTURE) ×2
SUT VIC AB 2-0 CP2 18 (SUTURE) ×4 IMPLANT
SYR 20CC LL (SYRINGE) ×2 IMPLANT
SYR 20ML ECCENTRIC (SYRINGE) ×2 IMPLANT
TAPE CLOTH SURG 4X10 WHT LF (GAUZE/BANDAGES/DRESSINGS) ×2 IMPLANT
TOWEL OR 17X24 6PK STRL BLUE (TOWEL DISPOSABLE) ×2 IMPLANT
TOWEL OR 17X26 10 PK STRL BLUE (TOWEL DISPOSABLE) ×2 IMPLANT
TRAY FOLEY CATH 14FRSI W/METER (CATHETERS) ×2 IMPLANT
WATER STERILE IRR 1000ML POUR (IV SOLUTION) ×2 IMPLANT

## 2013-08-29 NOTE — Op Note (Signed)
Brief history: The patient is a 79 year old white female who's had multiple problems in the past with her back. She has had multiple lumbar compression fractures. She's undergone multiple vertebroplasty by another physician. She's had persistent back and leg pain consistent with neurogenic claudication. She has failed medical management. She was worked up with a lumbar MRI and lumbar x-rays. This demonstrated an L4 burst fracture with spondylolisthesis and spinal stenosis. I discussed the various treatment option with the patient and her family. They have weighed the risks, benefits, and alternatives surgery and decided proceed with a lumbar decompression, instrumentation, and fusion.  Preoperative diagnosis: L4 burst fracture, L4-L5 spondylolisthesis, L4-5 spinal stenosis compressing both the L4 and the L5 nerve roots; lumbago; lumbar radiculopathy; neurogenic claudication  Postoperative diagnosis: The same  Procedure: L4 laminectomy/foraminotomies to decompress the bilateral L4 and L5 nerve roots(the work required to do this was in addition to the work required to do the posterior lumbar interbody fusion because of the patient's spinal stenosis, facet arthropathy. Etc. requiring a wide decompression of the nerve roots.): posterior segmental instrumentation from L3 to L5 with globus titanium pedicle screws and rods; posterior lateral arthrodesis at L3-4 and L4-5 with local morselized autograft bone and Vitoss bone graft extender.  Surgeon: Dr. Earle Gell  Asst.: Dr. Dominica Severin cram  Anesthesia: Gen. endotracheal  Estimated blood loss: 200 cc  Drains: One medium Hemovac  Complications: None  Description of procedure: The patient was brought to the operating room by the anesthesia team. General endotracheal anesthesia was induced. The patient was turned to the prone position on the Wilson frame. The patient's lumbosacral region was then prepared with Betadine scrub and Betadine solution. Sterile  drapes were applied.  I then injected the area to be incised with Marcaine with epinephrine solution. I then used the scalpel to make a linear midline incision over the L3-4 and L4-5 interspace. I then used electrocautery to perform a bilateral subperiosteal dissection exposing the spinous process and lamina of L2 to the upper sacrum. We then obtained intraoperative radiograph to confirm our location. We then inserted the Verstrac retractor to provide exposure. I incised interspinous ligament at L3-4 and L4-5 with a scalpel. I used Leksell rongeur to remove the spinous process of L4 and the caudal aspect of the L3 spinous process. We saved this bone and later morselized it for local autograft bone to be used in the fusion.  I began the decompression by using the high speed drill to perform laminotomies at L4 bilaterally. We then used the Kerrison punches to complete the laminectomy at L4 and removed the ligamentum flavum at L3-4 and L4-5. We used the Kerrison punches to remove the medial facets at L4-5. We performed wide foraminotomies about the bilateral L4 and L5 nerve roots completing the decompression.   We now turned attention to the instrumentation. Under fluoroscopic guidance we cannulated the bilateral L3 and L5 pedicles with the bone probe. We then removed the bone probe. We then tapped the pedicle with a 6.5 millimeter tap. We then removed the tap. We probed inside the tapped pedicle with a ball probe to rule out cortical breaches. We then inserted a 7.5 x 50 millimeter pedicle screw into the L3 and L5 pedicles bilaterally under fluoroscopic guidance. We then palpated along the medial aspect of the pedicles to rule out cortical breaches. There were none. The nerve roots were not injured. We then connected the unilateral pedicle screws with a lordotic rod. We compressed the construct and secured the rod in  place with the caps. We then tightened the caps appropriately. This completed the  instrumentation from L3-L5.  We now turned our attention to the posterior lateral arthrodesis at L3-4 and L4-5. We used the high-speed drill to decorticate the remainder of the facets, pars, transverse process at L3-4 and L4-5. We then applied a combination of local morselized autograft bone and Vitoss bone graft extender over these decorticated posterior lateral structures. This completed the posterior lateral arthrodesis.  We then obtained hemostasis using bipolar electrocautery. We irrigated the wound out with bacitracin solution. We inspected the thecal sac and nerve roots and noted they were well decompressed. We then removed the retractor. We placed a medium Hemovac drain in the epidural space and tunneled out through separate stab wound. We reapproximated patient's thoracolumbar fascia with interrupted #1 Vicryl suture. We reapproximated patient's subcutaneous tissue with interrupted 2-0 Vicryl suture. The reapproximated patient's skin with Steri-Strips and benzoin. The wound was then coated with bacitracin ointment. A sterile dressing was applied. The drapes were removed. The patient was subsequently returned to the supine position where they were extubated by the anesthesia team. He was then transported to the post anesthesia care unit in stable condition. All sponge instrument and needle counts were reportedly correct at the end of this case.

## 2013-08-29 NOTE — Anesthesia Postprocedure Evaluation (Signed)
  Anesthesia Post-op Note  Patient: Monica Evans  Procedure(s) Performed: Procedure(s) with comments: LUMBAR THREE TO FOUR, LUMBAR FOUR TO FIVE POSTERIOR LUMBAR FUSION 2 LEVEL (N/A) - L34 L45 posterior lumbar interbody fusion with interbody prosthesis posterior lateral arthrodesis and posterior segmental instrumentation  Patient Location: PACU  Anesthesia Type:General  Level of Consciousness: awake  Airway and Oxygen Therapy: Patient Spontanous Breathing  Post-op Pain: mild  Post-op Assessment: Post-op Vital signs reviewed  Post-op Vital Signs: Reviewed  Last Vitals:  Filed Vitals:   08/29/13 1049  BP: 144/56  Pulse: 83  Temp: 36 C  Resp: 12    Complications: No apparent anesthesia complications

## 2013-08-29 NOTE — Evaluation (Signed)
Occupational Therapy Evaluation Patient Details Name: Monica Evans MRN: [id number] DOB: [birth year] Today's Date: 08/29/2013    History of Present Illness Pt is a 79 y/o female admitted with back and leg pain. She has failed medical management and was worked up with a lumbar MRI. This demonstrated patient had severe spinal stenosis at L4-5 and a fracture at L4. Pt is now s/p L34 L45 posterior lumbar interbody fusion with interbody prosthesis posterior lateral arthrodesis and posterior segmental instrumentation.   Clinical Impression   Pt s/p above. Pt independent with ADLs, PTA. Feel pt will benefit from acute OT to increase independence prior to d/c.    Follow Up Recommendations  No OT follow up;Supervision - Intermittent    Equipment Recommendations  None recommended by OT    Recommendations for Other Services       Precautions / Restrictions Precautions Precautions: Fall;Back Precaution Booklet Issued:  (one in room) Precaution Comments: Reviewed precautions with pt Required Braces or Orthoses: Spinal Brace Spinal Brace: Lumbar corset;Other (comment) (doffed in sitting) Restrictions Weight Bearing Restrictions: No      Mobility Bed Mobility Overal bed mobility: Needs Assistance Bed Mobility: Rolling;Sit to Sidelying Rolling: Mod assist     Sit to sidelying: Mod assist General bed mobility comments: Cues for technique. Assistance to try to keep body in alignment.  Transfers Overall transfer level: Needs assistance Equipment used: Rolling walker (2 wheeled) Transfers: Sit to/from Stand Sit to Stand: Min guard         General transfer comment: Cues for technique.         ADL Overall ADL's : Needs assistance/impaired                 Upper Body Dressing : Minimal assistance;Sitting (back brace)   Lower Body Dressing: Minimal assistance;Sit to/from stand   Toilet Transfer: Min guard;Ambulation;RW (chair/bed)   Toileting- Clothing Manipulation and  Hygiene: Min guard;Sit to/from stand       Functional mobility during ADLs: Min guard;Rolling walker General ADL Comments: Educated on use of cup for teeth care and placement of grooming items to avoid breaking precautions. Educated on back brace (have clothing underneath and not to sleep in brace). Educated on technique for LB ADLs. Also, educated on AE-pt has reacher at home. Pt ambulated in room and then returned to bed.     Vision                     Perception     Praxis      Pertinent Vitals/Pain Pain 5-6/10. Repositioned.      Hand Dominance Right   Extremity/Trunk Assessment Upper Extremity Assessment Upper Extremity Assessment: Overall WFL for tasks assessed   Lower Extremity Assessment Lower Extremity Assessment: Defer to PT evaluation   Cervical / Trunk Assessment Cervical / Trunk Assessment: Normal   Communication Communication Communication: No difficulties   Cognition Arousal/Alertness: Awake/alert Behavior During Therapy: WFL for tasks assessed/performed Overall Cognitive Status: Within Functional Limits for tasks assessed                     General Comments       Exercises       Shoulder Instructions      Home Living Family/patient expects to be discharged to:: Private residence Living Arrangements: Children Available Help at Discharge: Family;Available 24 hours/day Type of Home: House Home Access: Ramped entrance     Home Layout: Two level;Able to live on main level with bedroom/bathroom  Bathroom Shower/Tub: Occupational psychologist: Standard     Home Equipment: Environmental consultant - 2 wheels;Wheelchair - manual;Cane - single point;Bedside commode;Toilet riser;Adaptive equipment Adaptive Equipment: Reacher        Prior Functioning/Environment Level of Independence: Independent with assistive device(s)        Comments: Still driving, does all grocery shopping.     OT Diagnosis: Acute pain   OT Problem List:  Decreased knowledge of use of DME or AE;Decreased knowledge of precautions;Pain;Decreased strength   OT Treatment/Interventions: Self-care/ADL training;DME and/or AE instruction;Therapeutic activities;Patient/family education;Balance training    OT Goals(Current goals can be found in the care plan section) Acute Rehab OT Goals Patient Stated Goal: not stated OT Goal Formulation: With patient Time For Goal Achievement: 09/05/13 Potential to Achieve Goals: Good ADL Goals Pt Will Perform Grooming: with modified independence;standing Pt Will Perform Lower Body Dressing: with modified independence;sit to/from stand Pt Will Transfer to Toilet: with modified independence;ambulating Pt Will Perform Toileting - Clothing Manipulation and hygiene: with modified independence;sit to/from stand Additional ADL Goal #1: Pt will independently verbalize and demonstrate 3/3 back precautions.  Additional ADL Goal #2: Pt will independently don/doff back brace.   OT Frequency: Min 2X/week   Barriers to D/C:            Co-evaluation              End of Session Equipment Utilized During Treatment: Gait belt;Rolling walker;Back brace  Activity Tolerance: Patient tolerated treatment well Patient left: in bed;with call bell/phone within reach   Time: 1707-1730 OT Time Calculation (min): 23 min Charges:  OT General Charges $OT Visit: 1 Procedure OT Evaluation $Initial OT Evaluation Tier I: 1 Procedure OT Treatments $Self Care/Home Management : 8-22 mins G-CodesBenito Mccreedy OTR/L [phone] 08/29/2013, 6:38 PM

## 2013-08-29 NOTE — Progress Notes (Signed)
Subjective:  The patient is alert and pleasant. She is in no apparent distress.  Objective: Vital signs in last 24 hours: Temp:  [96.8 F (36 C)-97.7 F (36.5 C)] 96.8 F (36 C) (06/22 1049) Pulse Rate:  [73-83] 83 (06/22 1049) Resp:  [12-18] 12 (06/22 1049) BP: (144-158)/(56-62) 144/56 mmHg (06/22 1049) SpO2:  [94 %-98 %] 98 % (06/22 1049)  Intake/Output from previous day:   Intake/Output this shift: Total I/O In: 1000 [I.V.:1000] Out: 550 [Urine:350; Blood:200]  Physical exam the patient is pleasant. She is moving all 4 extremities well.  Lab Results: No results found for this basename: WBC, HGB, HCT, PLT,  in the last 72 hours BMET No results found for this basename: NA, K, CL, CO2, GLUCOSE, BUN, CREATININE, CALCIUM,  in the last 72 hours  Studies/Results: Dg Lumbar Spine 2-3 Views  08/29/2013   CLINICAL DATA:  Lumbar fusion  EXAM: DG C-ARM 61-120 MIN; LUMBAR SPINE - 2-3 VIEW  COMPARISON:  Lumbar film from earlier in the same day  FINDINGS: Two spot films were obtained intraoperatively and reveal pedicle screws at L3 and L5. Changes of prior vertebral augmentation are noted at L4. 30 seconds of fluoroscopy was utilized.   Electronically Signed   By: Inez Catalina M.D.   On: 08/29/2013 10:46   Dg Lumbar Spine 1 View  08/29/2013   CLINICAL DATA:  L3-4 and L4-5 fusion.  EXAM: LUMBAR SPINE - 1 VIEW  COMPARISON:  11/28/2011  FINDINGS: Using the same numbering scheme as the study from 11/28/2011 the vertebra were labeled. Treated fracture deformities at L1 and L4 are again identified. Tissue spreaders are posterior to the L4 vertebra. There is a surgical probe which is directly posterior to the L4-5 disc space.  IMPRESSION: 1. Portable intraoperative radiograph obtained for lumbar spine level localization.   Electronically Signed   By: Kerby Moors M.D.   On: 08/29/2013 10:44   Dg C-arm 1-60 Min  08/29/2013   CLINICAL DATA:  Lumbar fusion  EXAM: DG C-ARM 61-120 MIN; LUMBAR SPINE -  2-3 VIEW  COMPARISON:  Lumbar film from earlier in the same day  FINDINGS: Two spot films were obtained intraoperatively and reveal pedicle screws at L3 and L5. Changes of prior vertebral augmentation are noted at L4. 30 seconds of fluoroscopy was utilized.   Electronically Signed   By: Inez Catalina M.D.   On: 08/29/2013 10:46    Assessment/Plan: The patient is doing well. I spoke with her family.  LOS: 0 days     JENKINS,JEFFREY D 08/29/2013, 11:03 AM

## 2013-08-29 NOTE — H&P (Signed)
Subjective: The patient is a 79 year old white female who has complained of back and leg pain. She has failed medical management and was worked up with a lumbar MRI. This demonstrated patient had severe spinal stenosis at L4-5 and a fracture at L4. We discussed the various treatment options including surgery. The patient has weighed the risks, benefits, and alternatives surgery and decided proceed with an L4-5 decompression with instrumentation and fusion from L3-L5 and possible interbody fusion.  Past Medical History  Diagnosis Date  . Diabetes mellitus without complication     prediabetes- HgbA1C- has been monitored  by PCP- Dr. Nicki Reaper- [facility]   . Complication of anesthesia   . PONV (postoperative nausea and vomiting)     nausea  . Hypertension   . Pneumonia     hosp. many yrs. ago for pneumonia  . GERD (gastroesophageal reflux disease)   . Arthritis     + lumbar stenosis  . 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 old that has lead to chest/lung scarring .  Marland Kitchen Apical lung scarring     lung scarring related to disease that was diagnosed when she was 79 y.o.   . 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    . Glaucoma     left eye only    Past Surgical History  Procedure Laterality Date  . Eye surgery Bilateral     IOL only in R eye  . Tubal ligation      Allergies  Allergen Reactions  . Penicillins Swelling    History  Substance Use Topics  . Smoking status: Never Smoker   . Smokeless tobacco: Not on file  . Alcohol Use: Not on file    History reviewed. No pertinent family history. Prior to Admission medications   Medication Sig Start Date End Date Taking? Authorizing [place]  aspirin 81 MG tablet Take 81 mg by mouth at bedtime.   Yes Historical Worthy Boschert, MD  Calcium Carbonate-Vitamin D (CALCIUM-VITAMIN D) 500-200 MG-UNIT per tablet Take 1 tablet by mouth 2 (two) times daily.   Yes Historical Maccoy Haubner,  MD  co-enzyme Q-10 30 MG capsule Take 30 mg by mouth daily.   Yes Historical Hannah Crill, MD  furosemide (LASIX) 40 MG tablet Take 80 mg by mouth every evening.   Yes Historical Yaphet Smethurst, MD  ibuprofen (ADVIL,MOTRIN) 200 MG tablet Take 200 mg by mouth every 4 (four) hours as needed.   Yes Historical Aynslee Mulhall, MD  omeprazole (PRILOSEC) 20 MG capsule Take 20 mg by mouth daily.   Yes Historical Nashayla Telleria, MD  potassium chloride (MICRO-K) 10 MEQ CR capsule Take 30 mEq by mouth 2 (two) times daily.   Yes Historical Gotti Alwin, MD  predniSONE (DELTASONE) 10 MG tablet Take 10 mg by mouth daily with breakfast.   Yes Historical Temprance Wyre, MD  timolol (BETIMOL) 0.5 % ophthalmic solution Place 1 drop into the left eye 2 (two) times daily.   Yes Historical Gudrun Axe, MD  traMADol (ULTRAM) 50 MG tablet Take 50 mg by mouth every 6 (six) hours as needed for moderate pain.   Yes Historical Gumecindo Hopkin, MD  verapamil (CALAN-SR) 240 MG CR tablet Take 240 mg by mouth at bedtime.   Yes Historical Aramis Weil, MD     Review of Systems  Positive ROS: As above  All other systems have been reviewed and were otherwise negative with the exception of those mentioned in the HPI and as  above.  Objective: Vital signs in last 24 hours: Temp:  [97.7 F (36.5 C)] 97.7 F (36.5 C) (06/22 0645) Pulse Rate:  [73] 73 (06/22 0645) Resp:  [18] 18 (06/22 0645) BP: (158)/(62) 158/62 mmHg (06/22 0645) SpO2:  [94 %] 94 % (06/22 0645)  General Appearance: Alert, cooperative, no distress, Head: Normocephalic, without obvious abnormality, atraumatic Eyes: PERRL, conjunctiva/corneas clear, EOM's intact,    Ears: Normal  Throat: Normal  Neck: Supple, symmetrical, trachea midline, no adenopathy; thyroid: No enlargement/tenderness/nodules; no carotid bruit or JVD Back: Symmetric, no curvature, ROM normal, no CVA tenderness Lungs: Clear to auscultation bilaterally, respirations unlabored Heart: Regular rate and rhythm, no murmur, rub or  gallop Abdomen: Soft, non-tender,, no masses, no organomegaly Extremities: Extremities normal, atraumatic, no cyanosis or edema Pulses: 2+ and symmetric all extremities Skin: Skin color, texture, turgor normal, no rashes or lesions  NEUROLOGIC:   Mental status: alert and oriented, no aphasia, good attention span, Fund of knowledge/ memory ok Motor Exam - grossly normal Sensory Exam - grossly normal Reflexes:  Coordination - grossly normal Gait - grossly normal Balance - grossly normal Cranial Nerves: I: smell Not tested  II: visual acuity  OS: Normal  OD: Normal   II: visual fields Full to confrontation  II: pupils Equal, round, reactive to light  III,VII: ptosis None  III,IV,VI: extraocular muscles  Full ROM  V: mastication Normal  V: facial light touch sensation  Normal  V,VII: corneal reflex  Present  VII: facial muscle function - upper  Normal  VII: facial muscle function - lower Normal  VIII: hearing Not tested  IX: soft palate elevation  Normal  IX,X: gag reflex Present  XI: trapezius strength  5/5  XI: sternocleidomastoid strength 5/5  XI: neck flexion strength  5/5  XII: tongue strength  Normal    Data Review Lab Results  Component Value Date   WBC 13.4* 08/18/2013   HGB 13.6 08/18/2013   HCT 41.8 08/18/2013   MCV 103.7* 08/18/2013   PLT 285 08/18/2013   Lab Results  Component Value Date   NA 141 08/18/2013   K 4.8 08/18/2013   CL 100 08/18/2013   CO2 31 08/18/2013   BUN 20 08/18/2013   CREATININE 1.14* 08/18/2013   GLUCOSE 146* 08/18/2013   No results found for this basename: INR, PROTIME    Assessment/Plan: L4 fracture, L4-5 spondylolisthesis, spinal stenosis, lumbago, lumbar radiculopathy, neurogenic claudication: I discussed situation with the patient. I have reviewed her imaging studies with her and pointed out the abnormalities. We have discussed the various treatment options including surgery. I have described the surgical treatment option of a  decompressive laminectomy with instrumentation and fusion from L3-L5 and possible interbody fusion. I have shown her surgical models. We have discussed the risks, benefits, alternatives, and likelihood of achieving our goals with surgery. I have answered all the patient's questions. She has decided proceed with surgery.   JENKINS,JEFFREY D 08/29/2013 7:14 AM

## 2013-08-29 NOTE — OR Nursing (Signed)
Patient received skin tear on right forearm while positioning.  Physician notified Tegaderm and gauze applied.  Will continue to monitor.

## 2013-08-29 NOTE — Evaluation (Signed)
Physical Therapy Evaluation Patient Details Name: Monica Evans MRN: [id number] DOB: 1934-10-12 Today's Date: 08/29/2013   History of Present Illness  Pt is a 79 y/o female admitted with back and leg pain. She has failed medical management and was worked up with a lumbar MRI. This demonstrated patient had severe spinal stenosis at L4-5 and a fracture at L4. Pt is now s/p L34 L45 posterior lumbar interbody fusion with interbody prosthesis posterior lateral arthrodesis and posterior segmental instrumentation.  Clinical Impression  This patient presents with acute pain and decreased functional independence following the above mentioned procedure. At the time of PT eval, pt was able to ambulate in room, however was limited by pain and fatigue. This patient is appropriate for skilled PT interventions to address functional limitations, improve safety and independence with functional mobility, and return to PLOF.     Follow Up Recommendations Home health PT;Supervision for mobility/OOB    Equipment Recommendations  Rolling walker with 5" wheels    Recommendations for Other Services       Precautions / Restrictions Precautions Precautions: Fall;Back Precaution Booklet Issued: Yes (comment) Required Braces or Orthoses: Spinal Brace Spinal Brace: Lumbar corset;Applied in supine position (Left sticky note for clarification if we can don in sitting) Restrictions Weight Bearing Restrictions: No      Mobility  Bed Mobility Overal bed mobility: Needs Assistance Bed Mobility: Rolling;Sidelying to Sit Rolling: Min assist Sidelying to sit: Mod assist       General bed mobility comments: VC's for sequencing and technique to maintain back precautions during log roll. Assist to elevate trunk to full sitting position.   Transfers Overall transfer level: Needs assistance Equipment used: Rolling walker (2 wheeled) Transfers: Sit to/from Stand Sit to Stand: Mod assist         General  transfer comment: VC's for hand placement on seated surface for safety, as well as to maintain back precautions. Assist to power-up to full stand.   Ambulation/Gait Ambulation/Gait assistance: Min guard Ambulation Distance (Feet): 25 Feet Assistive device: Rolling walker (2 wheeled) Gait Pattern/deviations: Step-through pattern;Decreased stride length;Narrow base of support Gait velocity: Decreased Gait velocity interpretation: Below normal speed for age/gender General Gait Details: VC's for general safety awareness with the RW. Pt appeared to be fatigued at the end of gait training and began shuffling her feet to advance.   Stairs            Wheelchair Mobility    Modified Rankin (Stroke Patients Only)       Balance Overall balance assessment: Needs assistance Sitting-balance support: Feet supported;No upper extremity supported Sitting balance-Leahy Scale: Fair     Standing balance support: Bilateral upper extremity supported Standing balance-Leahy Scale: Poor                               Pertinent Vitals/Pain Vitals stable throughout session.     Home Living Family/patient expects to be discharged to:: Private residence Living Arrangements: Children Available Help at Discharge: Family;Available 24 hours/day Type of Home: House Home Access: Ramped entrance     Home Layout: Two level;Able to live on main level with bedroom/bathroom Home Equipment: Gilford Rile - 2 wheels;Wheelchair - manual;Cane - single point      Prior Function Level of Independence: Independent with assistive device(s)         Comments: Still driving, does all grocery shopping.      Hand Dominance   Dominant Hand: Right  Extremity/Trunk Assessment   Upper Extremity Assessment: Defer to OT evaluation           Lower Extremity Assessment: Overall WFL for tasks assessed      Cervical / Trunk Assessment: Normal  Communication   Communication: No difficulties   Cognition Arousal/Alertness: Awake/alert Behavior During Therapy: WFL for tasks assessed/performed Overall Cognitive Status: Within Functional Limits for tasks assessed                      General Comments      Exercises        Assessment/Plan    PT Assessment Patient needs continued PT services  PT Diagnosis Difficulty walking;Acute pain   PT Problem List Decreased strength;Decreased range of motion;Decreased activity tolerance;Decreased balance;Decreased mobility;Decreased knowledge of use of DME;Decreased safety awareness;Decreased knowledge of precautions;Pain  PT Treatment Interventions DME instruction;Gait training;Stair training;Functional mobility training;Therapeutic activities;Therapeutic exercise;Neuromuscular re-education;Patient/family education   PT Goals (Current goals can be found in the Care Plan section) Acute Rehab PT Goals Patient Stated Goal: To decrease her pain PT Goal Formulation: With patient Time For Goal Achievement: 09/05/13 Potential to Achieve Goals: Good    Frequency Min 5X/week   Barriers to discharge        Co-evaluation               End of Session Equipment Utilized During Treatment: Back brace Activity Tolerance: Patient tolerated treatment well Patient left: in chair;with call bell/phone within reach Nurse Communication: Mobility status         Time: 5859-2924 PT Time Calculation (min): 26 min   Charges:   PT Evaluation $Initial PT Evaluation Tier I: 1 Procedure PT Treatments $Therapeutic Activity: 8-22 mins   PT G CodesJolyn Lent 08/29/2013, 4:50 PM  Jolyn Lent, PT, DPT Acute Rehabilitation Services Pager: [phone]

## 2013-08-29 NOTE — Anesthesia Preprocedure Evaluation (Addendum)
Anesthesia Evaluation  Patient identified by MRN, date of birth, ID band Patient awake    Reviewed: Allergy & Precautions, H&P , NPO status , Patient's Chart, lab work & pertinent test results  History of Anesthesia Complications (+) PONV  Airway Mallampati: II      Dental  (+) Teeth Intact   Pulmonary pneumonia -,  breath sounds clear to auscultation        Cardiovascular hypertension, Rhythm:Regular Rate:Normal     Neuro/Psych    GI/Hepatic GERD-  ,  Endo/Other  diabetes  Renal/GU      Musculoskeletal   Abdominal   Peds  Hematology   Anesthesia Other Findings   Reproductive/Obstetrics                         Anesthesia Physical Anesthesia Plan  ASA: III  Anesthesia Plan: General   Post-op Pain Management:    Induction: Intravenous  Airway Management Planned: Oral ETT  Additional Equipment:   Intra-op Plan:   Post-operative Plan: Extubation in OR  Informed Consent: I have reviewed the patients History and Physical, chart, labs and discussed the procedure including the risks, benefits and alternatives for the proposed anesthesia with the patient or authorized representative who has indicated his/her understanding and acceptance.   Dental advisory given  Plan Discussed with: CRNA and Anesthesiologist  Anesthesia Plan Comments:         Anesthesia Quick Evaluation

## 2013-08-29 NOTE — Transfer of Care (Signed)
Immediate Anesthesia Transfer of Care Note  Patient: Monica Evans  Procedure(s) Performed: Procedure(s) with comments: LUMBAR THREE TO FOUR, LUMBAR FOUR TO FIVE POSTERIOR LUMBAR FUSION 2 LEVEL (N/A) - L34 L45 posterior lumbar interbody fusion with interbody prosthesis posterior lateral arthrodesis and posterior segmental instrumentation  Patient Location: PACU  Anesthesia Type:General  Level of Consciousness: awake, alert  and oriented  Airway & Oxygen Therapy: Patient Spontanous Breathing and Patient connected to face mask oxygen  Post-op Assessment: Report given to PACU RN, Post -op Vital signs reviewed and stable and Patient moving all extremities X 4  Post vital signs: Reviewed and stable  Complications: No apparent anesthesia complications

## 2013-08-30 LAB — CBC
HCT: 36 % (ref 36.0–46.0)
Hemoglobin: 11.5 g/dL — ABNORMAL LOW (ref 12.0–15.0)
MCH: 33.1 pg (ref 26.0–34.0)
MCHC: 31.9 g/dL (ref 30.0–36.0)
MCV: 103.7 fL — AB (ref 78.0–100.0)
PLATELETS: 244 10*3/uL (ref 150–400)
RBC: 3.47 MIL/uL — ABNORMAL LOW (ref 3.87–5.11)
RDW: 13.5 % (ref 11.5–15.5)
WBC: 16.7 10*3/uL — ABNORMAL HIGH (ref 4.0–10.5)

## 2013-08-30 LAB — BASIC METABOLIC PANEL
BUN: 19 mg/dL (ref 6–23)
CALCIUM: 8.3 mg/dL — AB (ref 8.4–10.5)
CO2: 28 meq/L (ref 19–32)
CREATININE: 0.91 mg/dL (ref 0.50–1.10)
Chloride: 101 mEq/L (ref 96–112)
GFR calc non Af Amer: 59 mL/min — ABNORMAL LOW (ref >90)
GFR, EST AFRICAN AMERICAN: 69 mL/min — AB (ref >90)
Glucose, Bld: 144 mg/dL — ABNORMAL HIGH (ref 70–99)
Potassium: 4.1 mEq/L (ref 3.7–5.3)
Sodium: 141 mEq/L (ref 137–147)

## 2013-08-30 LAB — GLUCOSE, CAPILLARY: GLUCOSE-CAPILLARY: 122 mg/dL — AB (ref 70–99)

## 2013-08-30 MED ORDER — FUROSEMIDE 40 MG PO TABS
40.0000 mg | ORAL_TABLET | Freq: Every evening | ORAL | Status: DC
  Administered 2013-08-30: 40 mg via ORAL
  Filled 2013-08-30: qty 1

## 2013-08-30 MED FILL — Sodium Chloride IV Soln 0.9%: INTRAVENOUS | Qty: 1000 | Status: AC

## 2013-08-30 MED FILL — Heparin Sodium (Porcine) Inj 1000 Unit/ML: INTRAMUSCULAR | Qty: 30 | Status: AC

## 2013-08-30 NOTE — Progress Notes (Signed)
Read, reviewed, edited and agree with student's findings and recommendations.  Rebecca B. Medendorp, PT, DPT #[phone]  

## 2013-08-30 NOTE — Progress Notes (Signed)
Physical Therapy Treatment Patient Details Name: Monica Evans MRN: [id number] DOB: [birth year] Today's Date: 08/30/2013    History of Present Illness Pt is a 79 y/o female admitted 08/29/13 with back and leg pain. She was worked up with a lumbar MRI which demonstrated patient had severe spinal stenosis at L4-5 and a fracture at L4. Pt is now s/p L3-5 decompression and fusion. PMHx of diabetes mellitus and HTN.    PT Comments    Patient is mobilizing well with use of walker. She has good family support at home and is well enough to go home with family supervision at d/c. Patient will benefit from HHPT.   Follow Up Recommendations  Home health PT;Supervision for mobility/OOB           Precautions / Restrictions Precautions Precautions: Back (Remembered 2/3 precautions) Precaution Booklet Issued: Yes (comment) Precaution Comments: Reviewed precautions with pt Required Braces or Orthoses: Spinal Brace Spinal Brace: Lumbar corset;Other (comment) Spinal Brace Comments: don in sitting Restrictions Weight Bearing Restrictions: No    Mobility  Bed Mobility Overal bed mobility: Needs Assistance Bed Mobility: Rolling;Sidelying to Sit Rolling: Mod assist Sidelying to sit: Mod assist       General bed mobility comments: Cues for technique. Assistance to try to keep body in alignment.  Transfers Overall transfer level: Needs assistance Equipment used: Rolling walker (2 wheeled) Transfers: Sit to/from Stand Sit to Stand: Min guard            Ambulation/Gait Ambulation/Gait assistance: Min guard Ambulation Distance (Feet): 100 Feet Assistive device: Rolling walker (2 wheeled) Gait Pattern/deviations: Step-through pattern     General Gait Details: Patient cued for walker safety and upright posture. Patient was min guard for general safety.        Balance Overall balance assessment: Needs assistance Sitting-balance support: Feet supported;Bilateral upper extremity  supported Sitting balance-Leahy Scale: Poor     Standing balance support: Bilateral upper extremity supported Standing balance-Leahy Scale: Poor                      Cognition Arousal/Alertness: Awake/alert Behavior During Therapy: WFL for tasks assessed/performed Overall Cognitive Status: Within Functional Limits for tasks assessed                             Pertinent Vitals/Pain See vitals flow sheet.           PT Goals (current goals can now be found in the care plan section) Acute Rehab PT Goals PT Goal Formulation: With patient Time For Goal Achievement: 09/13/13 Potential to Achieve Goals: Good    Frequency  Min 5X/week     End of Session Equipment Utilized During Treatment: Back brace Activity Tolerance: Patient tolerated treatment well Patient left: in chair;with call bell/phone within reach;with chair alarm set     Time: 5093-2671 PT Time Calculation (min): 23 min  Charges:  $Gait Training: 8-22 mins $Therapeutic Activity: 8-22 mins                     Eber Jones, [place] [phone]

## 2013-08-30 NOTE — Progress Notes (Signed)
Utilization review completed.  

## 2013-08-30 NOTE — Progress Notes (Signed)
SPOKE WITH DR CRAM RE LOW BP, DBP ~44. ADDRESSED MATTER OF 1800 LASIX 80MG . ORDER GIVEN TO CUT TO 40MG  TONIGHT.

## 2013-08-30 NOTE — Progress Notes (Signed)
Patient ID: Monica Evans, female   DOB: 06/14/1934, 79 y.o.   MRN: [id number] Subjective:  The patient is alert and pleasant. Her back is appropriately sore. She looks well. She is pleased that her left leg pain has resolved with surgery.  Objective: Vital signs in last 24 hours: Temp:  [96.8 F (36 C)-98.6 F (37 C)] 98.4 F (36.9 C) (06/23 0620) Pulse Rate:  [71-85] 78 (06/23 0620) Resp:  [12-20] 18 (06/23 0620) BP: (114-147)/(40-70) 128/55 mmHg (06/23 0620) SpO2:  [85 %-99 %] 95 % (06/23 0620) Weight:  [79 kg (174 lb 2.6 oz)] 79 kg (174 lb 2.6 oz) (06/22 1138)  Intake/Output from previous day: 06/22 0701 - 06/23 0700 In: 1200 [I.V.:1200] Out: 2380 [Urine:1930; Drains:250; Blood:200] Intake/Output this shift:    Physical exam the patient is alert and oriented. Her strength is normal in her lower extremities.  Lab Results:  Recent Labs  08/30/13 0414  WBC 16.7*  HGB 11.5*  HCT 36.0  PLT 244   BMET  Recent Labs  08/30/13 0414  NA 141  K 4.1  CL 101  CO2 28  GLUCOSE 144*  BUN 19  CREATININE 0.91  CALCIUM 8.3*    Studies/Results: Dg Lumbar Spine 2-3 Views  08/29/2013   CLINICAL DATA:  Lumbar fusion  EXAM: DG C-ARM 61-120 MIN; LUMBAR SPINE - 2-3 VIEW  COMPARISON:  Lumbar film from earlier in the same day  FINDINGS: Two spot films were obtained intraoperatively and reveal pedicle screws at L3 and L5. Changes of prior vertebral augmentation are noted at L4. 30 seconds of fluoroscopy was utilized.   Electronically Signed   By: Inez Catalina M.D.   On: 08/29/2013 10:46   Dg Lumbar Spine 1 View  08/29/2013   CLINICAL DATA:  L3-4 and L4-5 fusion.  EXAM: LUMBAR SPINE - 1 VIEW  COMPARISON:  11/28/2011  FINDINGS: Using the same numbering scheme as the study from 11/28/2011 the vertebra were labeled. Treated fracture deformities at L1 and L4 are again identified. Tissue spreaders are posterior to the L4 vertebra. There is a surgical probe which is directly posterior to the  L4-5 disc space.  IMPRESSION: 1. Portable intraoperative radiograph obtained for lumbar spine level localization.   Electronically Signed   By: Kerby Moors M.D.   On: 08/29/2013 10:44   Dg C-arm 1-60 Min  08/29/2013   CLINICAL DATA:  Lumbar fusion  EXAM: DG C-ARM 61-120 MIN; LUMBAR SPINE - 2-3 VIEW  COMPARISON:  Lumbar film from earlier in the same day  FINDINGS: Two spot films were obtained intraoperatively and reveal pedicle screws at L3 and L5. Changes of prior vertebral augmentation are noted at L4. 30 seconds of fluoroscopy was utilized.   Electronically Signed   By: Inez Catalina M.D.   On: 08/29/2013 10:46    Assessment/Plan: Postop day 1: The patient is doing well. We will mobilize her with PT and OT. We will discontinue her Hemovac drain. She will likely go home in a few days  LOS: 1 day     JENKINS,JEFFREY D 08/30/2013, 7:44 AM

## 2013-08-31 MED ORDER — ASPIRIN 81 MG PO CHEW
81.0000 mg | CHEWABLE_TABLET | Freq: Every day | ORAL | Status: DC
  Administered 2013-08-31 – 2013-09-02 (×3): 81 mg via ORAL
  Filled 2013-08-31 (×3): qty 1

## 2013-08-31 NOTE — Progress Notes (Signed)
PT Cancellation Note  Patient Details Name: Monica Evans MRN: [id number] DOB: 1934-06-06   Cancelled Treatment:    Reason Eval/Treat Not Completed: Other (comment) (pt just got back to bed and took pain meds. ).  Pt asked PT to come back after lunch.  PT to check back later as time allows.   Thanks,    Barbarann Ehlers. [place], [place], DPT [phone]   08/31/2013, 11:33 AM

## 2013-08-31 NOTE — Progress Notes (Signed)
Patient ID: Twanna Resh, female   DOB: Oct 14, 1934, 79 y.o.   MRN: [id number] Subjective:  The patient is alert and pleasant.  Objective: Vital signs in last 24 hours: Temp:  [98.4 F (36.9 C)-99.5 F (37.5 C)] 98.4 F (36.9 C) (06/24 0600) Pulse Rate:  [67-83] 76 (06/24 0600) Resp:  [18-20] 18 (06/24 0600) BP: (111-145)/(43-70) 126/70 mmHg (06/24 0600) SpO2:  [91 %-96 %] 95 % (06/24 0600)  Intake/Output from previous day: 06/23 0701 - 06/24 0700 In: 700 [P.O.:700] Out: 1625 [Urine:1625] Intake/Output this shift: Total I/O In: 360 [P.O.:360] Out: -   Physical exam the patient is alert and oriented. Her strength is normal in her lower extremity is.  Lab Results:  Recent Labs  08/30/13 0414  WBC 16.7*  HGB 11.5*  HCT 36.0  PLT 244   BMET  Recent Labs  08/30/13 0414  NA 141  K 4.1  CL 101  CO2 28  GLUCOSE 144*  BUN 19  CREATININE 0.91  CALCIUM 8.3*    Studies/Results: Dg Lumbar Spine 2-3 Views  08/29/2013   CLINICAL DATA:  Lumbar fusion  EXAM: DG C-ARM 61-120 MIN; LUMBAR SPINE - 2-3 VIEW  COMPARISON:  Lumbar film from earlier in the same day  FINDINGS: Two spot films were obtained intraoperatively and reveal pedicle screws at L3 and L5. Changes of prior vertebral augmentation are noted at L4. 30 seconds of fluoroscopy was utilized.   Electronically Signed   By: Inez Catalina M.D.   On: 08/29/2013 10:46   Dg C-arm 1-60 Min  08/29/2013   CLINICAL DATA:  Lumbar fusion  EXAM: DG C-ARM 61-120 MIN; LUMBAR SPINE - 2-3 VIEW  COMPARISON:  Lumbar film from earlier in the same day  FINDINGS: Two spot films were obtained intraoperatively and reveal pedicle screws at L3 and L5. Changes of prior vertebral augmentation are noted at L4. 30 seconds of fluoroscopy was utilized.   Electronically Signed   By: Inez Catalina M.D.   On: 08/29/2013 10:46    Assessment/Plan: Postop day #2: We will continue PT and OT. The patient would like to go home on Friday when she will have more  home family support.  LOS: 2 days     JENKINS,JEFFREY D 08/31/2013, 8:58 AM

## 2013-08-31 NOTE — Progress Notes (Signed)
Physical Therapy Treatment Patient Details Name: Monica Evans MRN: [id number] DOB: Aug 22, 1934 Today's Date: 08/31/2013    History of Present Illness Pt is a 79 y/o female admitted 08/29/13 with back and leg pain. She was worked up with a lumbar MRI which demonstrated patient had severe spinal stenosis at L4-5 and a fracture at L4. Pt is now s/p L3-5 decompression and fusion. PMHx of diabetes mellitus and HTN.    PT Comments    Pt is very limited by pain and soreness today.  Pain radiating into her right lateral thigh and right buttocks.  Pt continues to work and participate with PT and we continue to believe that she will progress well enough to go home with family's assist and HHPT.   Follow Up Recommendations  Home health PT;Supervision for mobility/OOB     Equipment Recommendations  Rolling walker with 5" wheels    Recommendations for Other Services    NA     Precautions / Restrictions Precautions Precautions: Back Precaution Comments: Reviewed precautions with pt.  She was able to report 2/3 precautions.  Required Braces or Orthoses: Spinal Brace Spinal Brace: Lumbar corset;Other (comment) Spinal Brace Comments: don in sitting    Mobility  Bed Mobility Overal bed mobility: Needs Assistance Bed Mobility: Rolling;Sidelying to Sit;Sit to Sidelying Rolling: Mod assist Sidelying to sit: +2 for physical assistance;Mod assist     Sit to sidelying: +2 for safety/equipment;Mod assist General bed mobility comments: Two person mod assist needed for transitions due to pain and guarding today.  Pt needed assist at legs and trunk to go both up and down into bed using log roll technique.   Transfers Overall transfer level: Needs assistance Equipment used: Rolling walker (2 wheeled) Transfers: Sit to/from Stand Sit to Stand: +2 physical assistance;Min assist         General transfer comment: two person min assist needed for safety during transitions as pt reports increased bil  leg weakness today.  Verbal cues for safe hand placement.   Ambulation/Gait Ambulation/Gait assistance: +2 safety/equipment;Min assist Ambulation Distance (Feet): 55 Feet Assistive device: Rolling walker (2 wheeled) Gait Pattern/deviations: Step-through pattern;Shuffle (knees flexed and buckling bil at times) Gait velocity: Decreased Gait velocity interpretation: Below normal speed for age/gender General Gait Details: Pt was able to demonstrate good upright posture and safe positioning in RW during gait.  Gait speed slow and 1-2 instances of buckling requiring min assist for safety to ensure stability.           Balance Overall balance assessment: Needs assistance Sitting-balance support: Feet supported;Bilateral upper extremity supported Sitting balance-Leahy Scale: Poor     Standing balance support: Bilateral upper extremity supported Standing balance-Leahy Scale: Poor                      Cognition Arousal/Alertness: Awake/alert Behavior During Therapy: WFL for tasks assessed/performed Overall Cognitive Status: Within Functional Limits for tasks assessed                             Pertinent Vitals/Pain See vitals flow sheet.            PT Goals (current goals can now be found in the care plan section) Acute Rehab PT Goals Patient Stated Goal: to go home at d.c Progress towards PT goals: Not progressing toward goals - comment (limited by pain today)    Frequency  Min 5X/week    PT Plan Current plan remains appropriate  End of Session Equipment Utilized During Treatment: Gait belt;Back brace Activity Tolerance: Patient limited by pain;Patient limited by fatigue Patient left: in bed;with call bell/phone within reach;with bed alarm set     Time: 1350-1419 PT Time Calculation (min): 29 min  Charges:  $Gait Training: 8-22 mins $Therapeutic Activity: 8-22 mins                      Abyan Cadman B. [place], [place], DPT [phone]    08/31/2013, 2:51 PM

## 2013-08-31 NOTE — Progress Notes (Signed)
Occupational Therapy Treatment Patient Details Name: Monica Evans MRN: [id number] DOB: [birth year] Today's Date: 08/31/2013    History of present illness Pt is a 79 y/o female admitted 08/29/13 with back and leg pain. She was worked up with a lumbar MRI which demonstrated patient had severe spinal stenosis at L4-5 and a fracture at L4. Pt is now s/p L3-5 decompression and fusion. PMHx of diabetes mellitus and HTN.   OT comments  Pt seen today for ADL session and pt declined OOB activities due to pain and lethargy. Pt/family education completed for incorporating back precautions into ADLs and use of environmental modifications and energy conservation techniques. Pt would benefit from continued skilled OT to address bed mobility and ADLs with back precautions.    Follow Up Recommendations  No OT follow up;Supervision - Intermittent    Equipment Recommendations  None recommended by OT       Precautions / Restrictions Precautions Precautions: Back Precaution Comments: Educated pt on incorporating precautions into ADLs. Pt recalled 3/3 precautions Independently.  Required Braces or Orthoses: Spinal Brace Spinal Brace: Lumbar corset;Other (comment) Spinal Brace Comments: don in sitting       Mobility Bed Mobility     General bed mobility comments: Not addressed; pt declined OOB activities.   Transfers         General transfer comment: Not addressed; pt declined OOB activities.         ADL Overall ADL's : Needs assistance/impaired                                       General ADL Comments: Pt declined OOB activities due to pain and lethargy following PT session. Educated pt on environmental modifications and incorporating back precautions into ADLs. Pt verbalized sequencing of bed mobility (log roll) correctly. Reinforced that pt should not sleep in recliner or wear brace during sleep. Also reinforced pt not sitting for > 30-45 minutes at a time. OT will  continue to follow up with pt to practice don/doffing back brace, bed mobility, and functional mobility.                 Cognition  Arousal/Alertness: Lethargic, suspect due to medications Behavior During Therapy: WFL for tasks assessed/performed Overall Cognitive Status: Within Functional Limits for tasks assessed                                    Pertinent Vitals/ Pain       Pt c/o pain in back but did not rate pain. Offered ice pack and to reposition pt but she declined.          Frequency Min 2X/week     Progress Toward Goals  OT Goals(current goals can now be found in the care plan section)  Progress towards OT goals: Progressing toward goals  Acute Rehab OT Goals Patient Stated Goal: to go home at d.c  Plan Discharge plan remains appropriate       End of Session    Activity Tolerance Patient limited by lethargy;Patient limited by pain   Patient Left in bed;with call bell/phone within reach;with family/visitor present           Time: 3875-6433 OT Time Calculation (min): 17 min  Charges: OT General Charges $OT Visit: 1 Procedure OT Treatments $Self Care/Home Management : 8-22 mins  Sabra Heck,  Brett Canales [phone] 08/31/2013, 3:28 PM

## 2013-09-01 NOTE — Care Management Note (Unsigned)
    Page 1 of 1   09/01/2013     2:21:36 PM CARE MANAGEMENT NOTE 09/01/2013  Patient:  Monica Evans, Monica Evans   Account Number:  [id number]  Date Initiated:  09/01/2013  Documentation initiated by:  Lorne Skeens  Subjective/Objective Assessment:   Patient was admitted for a lumbar fusion. Lives at home alone. Daughter lives close by     Action/Plan:   Will follow for discharge needs pending PT/OT evals and physician orders.   Anticipated DC Date:  09/02/2013   Anticipated DC Plan:  [place]         Choice offered to / List presented to:             Status of service:  In process, will continue to follow Medicare Important Message given?  YES (If response is "NO", the following Medicare IM given date fields will be blank) Date Medicare IM given:  09/01/2013 Date Additional Medicare IM given:    Discharge Disposition:    Per UR Regulation:  Reviewed for med. necessity/level of care/duration of stay  If discussed at [place] of Stay Meetings, dates discussed:    Comments:  09/01/13 [place], MSN, CM- Met with patient to provide Medicare IM letter.

## 2013-09-01 NOTE — Progress Notes (Signed)
Patient ID: Monica Evans, female   DOB: [birth year], 79 y.o.   MRN: [id number] Subjective:  The patient is alert and pleasant. She wants to go home tomorrow.  Objective: Vital signs in last 24 hours: Temp:  [98.9 F (37.2 C)-99.6 F (37.6 C)] 99.3 F (37.4 C) (06/25 0603) Pulse Rate:  [70-90] 79 (06/25 0603) Resp:  [18-20] 18 (06/25 0603) BP: (124-145)/(50-56) 124/55 mmHg (06/25 0603) SpO2:  [92 %-97 %] 96 % (06/25 0603)  Intake/Output from previous day: 06/24 0701 - 06/25 0700 In: 600 [P.O.:600] Out: -  Intake/Output this shift:    Physical exam the patient is alert and oriented. She is moving all 4 extremities well. Her dressing is clean and dry.  Lab Results:  Recent Labs  08/30/13 0414  WBC 16.7*  HGB 11.5*  HCT 36.0  PLT 244   BMET  Recent Labs  08/30/13 0414  NA 141  K 4.1  CL 101  CO2 28  GLUCOSE 144*  BUN 19  CREATININE 0.91  CALCIUM 8.3*    Studies/Results: No results found.  Assessment/Plan: Postop day #3: The patient is doing well. We'll plan to send her home tomorrow.  LOS: 3 days     Blakleigh Straw D 09/01/2013, 7:52 AM

## 2013-09-01 NOTE — Progress Notes (Signed)
Occupational Therapy Treatment Patient Details Name: Monica Evans MRN: [id number] DOB: 02-22-1935 Today's Date: 09/01/2013    History of present illness Monica Evans is a 79 y.o. Female s/p PLIF for spinal stenosis and L4 fracture on 08/29/13.    OT comments  Pt seen today for ADLs and functional mobility. Pt presents with increased pain and weakness and decreased mobility this date. Pt unable to tolerate ambulation and required mod (A) for sit<>stand with RW. Feel that pt would benefit from SNF at this time to increase strength and endurance prior to return home for safety with ADLs.    Follow Up Recommendations  SNF;Supervision/Assistance - 24 hour    Equipment Recommendations  None recommended by OT       Precautions / Restrictions Precautions Precautions: Back;Fall Precaution Comments: Educated pt on incorporating precautions into ADLs. Pt recalled 3/3 precautions Independently.  Required Braces or Orthoses: Spinal Brace Spinal Brace: Lumbar corset;Other (comment) Spinal Brace Comments: don in sitting Restrictions Weight Bearing Restrictions: No       Mobility Bed Mobility Overal bed mobility: Needs Assistance Bed Mobility: Rolling;Sidelying to Sit;Sit to Sidelying Rolling: Mod assist Sidelying to sit: Mod assist     Sit to sidelying: Mod assist General bed mobility comments: Pt required mod (A) for bed mobility due to increased pain and weakness.   Transfers Overall transfer level: Needs assistance Equipment used: Rolling walker (2 wheeled) Transfers: Sit to/from Stand Sit to Stand: Mod assist         General transfer comment: VC's for safety. Pt attempted to stand x3 without assist and unable to complete. Pt required elevated bed and mod (A) to stand and was unable to take steps.         ADL Overall ADL's : Needs assistance/impaired                         Toilet Transfer: Minimal assistance;Moderate assistance;RW (sit<>stand)            Functional mobility during ADLs: Moderate assistance;Rolling walker (pt only able to take one step then needed to sit) General ADL Comments: Pt required greater assist for functional mobility this date. Pt appears fatigued and weak, c/o burning sensation and demonstrates with decreased ability to ambulate independently. Discussed ST Rehab with pt and she was receptive to the idea to increase safety with ADLs prior to d/c home.                  Cognition  Arousal/Alertness: Awake/Alert Behavior During Therapy: WFL for tasks assessed/performed;Anxious Overall Cognitive Status: Within Functional Limits for tasks assessed                                    Pertinent Vitals/ Pain       Pt c/o high pain in back and "burning sensation" in LEs. Repositioned pt for max comfort and notified RN of pt request for pain meds.          Frequency Min 2X/week     Progress Toward Goals  OT Goals(current goals can now be found in the care plan section)  Progress towards OT goals: Not progressing toward goals - comment (pt requiring increased assist for functional mobility)     Plan Discharge plan needs to be updated       End of Session Equipment Utilized During Treatment: Gait belt;Rolling walker;Back brace   Activity Tolerance Patient  limited by fatigue;Patient limited by pain   Patient Left in bed;with call bell/phone within reach   Nurse Communication  Pt requests pain meds.         Time: 1855-0158 OT Time Calculation (min): 28 min  Charges: OT General Charges $OT Visit: 1 Procedure OT Treatments $Self Care/Home Management : 23-37 mins  Juluis Rainier [phone] 09/01/2013, 3:17 PM

## 2013-09-01 NOTE — Progress Notes (Signed)
PT Cancellation Note  Patient Details Name: Monica Evans MRN: [id number] DOB: [birth year]   Cancelled Treatment:    Reason Eval/Treat Not Completed: Other (comment) (pt is too painful, already worked with OT this afternoon. ).  Pt per OT is doing worse today than previous sessions.  She worked with OT this PM and was mod assist for mobility and was not able to ambulate further than to the door with RW and support.  Pt is now wanting to pursue SNF for rehab at [facility].  PT is agreeable with this plan based on discussion with OT and pt.  PT will check back tomorrow.    Thanks,   Barbarann Ehlers. [place], [place], DPT [phone]   09/01/2013, 4:56 PM

## 2013-09-01 NOTE — Progress Notes (Signed)
Clinical Social Work Department BRIEF PSYCHOSOCIAL ASSESSMENT 09/01/2013  Patient:  NYANNA, HEIDEMAN     Account Number:  [id number]     Admit date:  08/29/2013  Clinical Social Worker:  Pete Pelt, [place]  Date/Time:  09/01/2013 06:01 PM  Referred by:  Physician  Date Referred:  09/01/2013 Referred for  SNF Placement   Other Referral:   Interview type:  Patient Other interview type:    PSYCHOSOCIAL DATA Living Status:  ALONE Admitted from facility:   Level of care:   Primary support name:  Exie Chrismer  [phone] Primary support relationship to patient:  CHILD, ADULT Degree of support available:   Pt has great support from her children with many of them living within minutes of the Pt's home.    CURRENT CONCERNS Current Concerns  Post-Acute Placement   Other Concerns:    SOCIAL WORK ASSESSMENT / PLAN CSW introduced self and reason for referral. Pt was aware that Pt was in need of a higher level of rehab sevices and "feels that her children would want her to go to a SNF for more therapy." Pt stated that "even though they check on me they can't be with me all the time." Pt has never been to a rehab facility, however stated that "she has been to see her cousin at [facility] in [place] and would like to go there." Pt stated it is in the same area that Pt lives. CSW was given permission to search in [facility].   Assessment/plan status:  Information/Referral to [employer] Other assessment/ plan:   Information/referral to community resources:   CSW provided a Scientist, forensic for the [place] area.    PATIENT'S/FAMILY'S RESPONSE TO PLAN OF CARE: Pt was appreciative for assistance with placement and for taking the time to provide supportive listening for the importance of placement options. CSW will continue to work with Pt for d/c planning.      [facility]  [address];  [phone] Phone: [phone]

## 2013-09-02 DIAGNOSIS — H409 Unspecified glaucoma: Secondary | ICD-10-CM | POA: Diagnosis not present

## 2013-09-02 DIAGNOSIS — IMO0002 Reserved for concepts with insufficient information to code with codable children: Secondary | ICD-10-CM | POA: Diagnosis not present

## 2013-09-02 DIAGNOSIS — Q762 Congenital spondylolisthesis: Secondary | ICD-10-CM | POA: Diagnosis not present

## 2013-09-02 DIAGNOSIS — M8448XA Pathological fracture, other site, initial encounter for fracture: Secondary | ICD-10-CM | POA: Diagnosis not present

## 2013-09-02 DIAGNOSIS — R262 Difficulty in walking, not elsewhere classified: Secondary | ICD-10-CM | POA: Diagnosis not present

## 2013-09-02 DIAGNOSIS — S32009A Unspecified fracture of unspecified lumbar vertebra, initial encounter for closed fracture: Secondary | ICD-10-CM | POA: Diagnosis not present

## 2013-09-02 DIAGNOSIS — M545 Low back pain, unspecified: Secondary | ICD-10-CM | POA: Diagnosis not present

## 2013-09-02 DIAGNOSIS — N189 Chronic kidney disease, unspecified: Secondary | ICD-10-CM | POA: Diagnosis not present

## 2013-09-02 DIAGNOSIS — G8918 Other acute postprocedural pain: Secondary | ICD-10-CM | POA: Diagnosis not present

## 2013-09-02 DIAGNOSIS — R5381 Other malaise: Secondary | ICD-10-CM | POA: Diagnosis not present

## 2013-09-02 DIAGNOSIS — K219 Gastro-esophageal reflux disease without esophagitis: Secondary | ICD-10-CM | POA: Diagnosis not present

## 2013-09-02 DIAGNOSIS — I130 Hypertensive heart and chronic kidney disease with heart failure and stage 1 through stage 4 chronic kidney disease, or unspecified chronic kidney disease: Secondary | ICD-10-CM | POA: Diagnosis not present

## 2013-09-02 DIAGNOSIS — M4800 Spinal stenosis, site unspecified: Secondary | ICD-10-CM | POA: Diagnosis not present

## 2013-09-02 DIAGNOSIS — Q7649 Other congenital malformations of spine, not associated with scoliosis: Secondary | ICD-10-CM | POA: Diagnosis not present

## 2013-09-02 DIAGNOSIS — M810 Age-related osteoporosis without current pathological fracture: Secondary | ICD-10-CM | POA: Diagnosis not present

## 2013-09-02 DIAGNOSIS — I10 Essential (primary) hypertension: Secondary | ICD-10-CM | POA: Diagnosis not present

## 2013-09-02 DIAGNOSIS — D721 Eosinophilia, unspecified: Secondary | ICD-10-CM | POA: Diagnosis not present

## 2013-09-02 DIAGNOSIS — I509 Heart failure, unspecified: Secondary | ICD-10-CM | POA: Diagnosis not present

## 2013-09-02 DIAGNOSIS — M5106 Intervertebral disc disorders with myelopathy, lumbar region: Secondary | ICD-10-CM | POA: Diagnosis not present

## 2013-09-02 MED ORDER — DIAZEPAM 5 MG PO TABS: 5.0000 mg | ORAL_TABLET | Freq: Four times a day (QID) | ORAL | Status: DC | PRN

## 2013-09-02 MED ORDER — OXYCODONE-ACETAMINOPHEN 5-325 MG PO TABS: 1.0000 | ORAL_TABLET | ORAL | Status: DC | PRN

## 2013-09-02 MED ORDER — DSS 100 MG PO CAPS: 100.0000 mg | ORAL_CAPSULE | Freq: Two times a day (BID) | ORAL | Status: DC

## 2013-09-02 NOTE — Progress Notes (Signed)
Physical Therapy Treatment Patient Details Name: Monica Evans MRN: [id number] DOB: Mar 18, 1934 Today's Date: 09/02/2013    History of Present Illness Monica Evans is a 79 y.o. Female s/p PLIF for spinal stenosis and L4 fracture on 08/29/13.     PT Comments    Patient mobilizing with use of walker and 2+ person support. Patient is appropriate for SNF level rehab. PT to follow acutely for deficits listed below.     Follow Up Recommendations  SNF     Equipment Recommendations  Rolling walker with 5" wheels       Precautions / Restrictions Precautions Precautions: Back;Fall Required Braces or Orthoses: Spinal Brace Spinal Brace: Lumbar corset Spinal Brace Comments: Per patient stating Dr. said it was okay to have the spinal brace off when using the bedside comode. Restrictions Weight Bearing Restrictions: No    Mobility  Bed Mobility Overal bed mobility: +2 for physical assistance;Needs Assistance Bed Mobility: Rolling;Sidelying to Sit;Sit to Sidelying Rolling: Mod assist;+2 for physical assistance Sidelying to sit: Mod assist;+2 for physical assistance     Sit to sidelying: Mod assist;+2 for physical assistance General bed mobility comments: Pt required 2 person assist with torso and legs for all bed mobility  Transfers Overall transfer level: Needs assistance Equipment used: Rolling walker (2 wheeled) Transfers: Sit to/from [facility] Sit to Stand: Mod assist;+2 physical assistance Stand pivot transfers: Mod assist;+2 physical assistance       General transfer comment: Patient needed 2 person assistance for sit to stand and stand pivot transfer to bedside comode. Patient was bending knees throughout transfer.      Balance Overall balance assessment: Needs assistance Sitting-balance support: Bilateral upper extremity supported;Single extremity supported;Feet supported;Feet unsupported Sitting balance-Leahy Scale: Poor Sitting balance -  Comments: Used bilateral arms for support due to pain   Standing balance support: Bilateral upper extremity supported Standing balance-Leahy Scale: Poor Standing balance comment: Patient required bilateral support of walker to remain standing                    Cognition Arousal/Alertness: Awake/alert Behavior During Therapy: WFL for tasks assessed/performed;Anxious Overall Cognitive Status: Within Functional Limits for tasks assessed                             Pertinent Vitals/Pain                                                                              See vitals flow sheet.             PT Goals (current goals can now be found in the care plan section) Acute Rehab PT Goals PT Goal Formulation: With patient Time For Goal Achievement: 09/16/13 Potential to Achieve Goals: Good    Frequency  Min 5X/week    PT Plan Current plan remains appropriate       End of Session Equipment Utilized During Treatment: Gait belt Activity Tolerance: Patient limited by pain Patient left: in bed;with call bell/phone within reach;with bed alarm set     Time: 5726-2035 PT Time Calculation (min): 21 min  Charges:  $Therapeutic Activity: 8-22 mins  G CodesEber Jones, [place] [phone]

## 2013-09-02 NOTE — Progress Notes (Signed)
Day nurse tried calling report before she left, but phones to [facility] facility were down. I finally got through and was able to give report to Ronny Bacon, LPN at [facility] in [place], where the pt was transferred to.

## 2013-09-02 NOTE — Discharge Summary (Signed)
Physician Discharge Summary  Patient ID: Monica Evans MRN: [id number] DOB/AGE: [birth year] 79 y.o.  Admit date: 08/29/2013 Discharge date: 09/02/2013  Admission Diagnoses: L4 burst fracture, L4-5 spondylolisthesis, spinal stenosis, lumbago, lumbar radiculopathy  Discharge Diagnoses: The same Active Problems:   Lumbar burst fracture   Discharged Condition: good  Hospital Course: I performed at L4-5 decompression with instrumentation and fusion at L3-4 and L4-5 on the patient on 08/29/2013. The surgery went well.  Patient's postoperative course was unremarkable. She was seen by PT and OT. Arrangements were made for her to go to a skilled nursing facility for convalescence and rehabilitation. The patient was given oral and written discharge instructions. All her questions were answered.  Consults: PT, OT Significant Diagnostic Studies: None Treatments: L4-5 decompression, L3-4 and L4-5 instrumentation and fusion Discharge Exam: Blood pressure 133/52, pulse 67, temperature 98.3 F (36.8 C), temperature source Oral, resp. rate 18, height 5' 1.02" (1.55 m), weight 79 kg (174 lb 2.6 oz), SpO2 96.00%. The patient is alert and pleasant. She is moving her lower extremities well. Her wound is healing well.  Disposition: Skilled nursing facility  Discharge Instructions   Call MD for:  difficulty breathing, headache or visual disturbances    Complete by:  As directed      Call MD for:  extreme fatigue    Complete by:  As directed      Call MD for:  hives    Complete by:  As directed      Call MD for:  persistant dizziness or light-headedness    Complete by:  As directed      Call MD for:  persistant nausea and vomiting    Complete by:  As directed      Call MD for:  redness, tenderness, or signs of infection (pain, swelling, redness, odor or green/yellow discharge around incision site)    Complete by:  As directed      Call MD for:  severe uncontrolled pain    Complete by:  As directed       Call MD for:  temperature >100.4    Complete by:  As directed      Diet - low sodium heart healthy    Complete by:  As directed      Discharge instructions    Complete by:  As directed   Call [phone] for a followup appointment. Take a stool softener while you are using pain medications.     Driving Restrictions    Complete by:  As directed   Do not drive for 2 weeks.     Increase activity slowly    Complete by:  As directed      Lifting restrictions    Complete by:  As directed   Do not lift more than 5 pounds. No excessive bending or twisting.     May shower / Bathe    Complete by:  As directed   He may shower after the pain she is removed 3 days after surgery. Leave the incision alone.     No dressing needed    Complete by:  As directed             Medication List    STOP taking these medications       ibuprofen 200 MG tablet  Commonly known as:  ADVIL,MOTRIN     traMADol 50 MG tablet  Commonly known as:  ULTRAM      TAKE these medications       aspirin  81 MG tablet  Take 81 mg by mouth at bedtime.     calcium-vitamin D 500-200 MG-UNIT per tablet  Take 1 tablet by mouth 2 (two) times daily.     co-enzyme Q-10 30 MG capsule  Take 30 mg by mouth daily.     diazepam 5 MG tablet  Commonly known as:  VALIUM  Take 1 tablet (5 mg total) by mouth every 6 (six) hours as needed for muscle spasms.     DSS 100 MG Caps  Take 100 mg by mouth 2 (two) times daily.     furosemide 40 MG tablet  Commonly known as:  LASIX  Take 80 mg by mouth every evening.     omeprazole 20 MG capsule  Commonly known as:  PRILOSEC  Take 20 mg by mouth daily.     oxyCODONE-acetaminophen 5-325 MG per tablet  Commonly known as:  PERCOCET/ROXICET  Take 1-2 tablets by mouth every 4 (four) hours as needed for moderate pain.     potassium chloride 10 MEQ CR capsule  Commonly known as:  MICRO-K  Take 30 mEq by mouth 2 (two) times daily.     predniSONE 10 MG tablet  Commonly  known as:  DELTASONE  Take 10 mg by mouth daily with breakfast.     timolol 0.5 % ophthalmic solution  Commonly known as:  BETIMOL  Place 1 drop into the left eye 2 (two) times daily.     verapamil 240 MG CR tablet  Commonly known as:  CALAN-SR  Take 240 mg by mouth at bedtime.         SignedOphelia Charter 09/02/2013, 10:18 AM

## 2013-09-02 NOTE — Progress Notes (Signed)
Read, reviewed, edited and agree with student's findings and recommendations.  Rebecca B. Medendorp, PT, DPT #[phone]  

## 2013-09-02 NOTE — Progress Notes (Signed)
Patient being discharged to [facility].  Report called and education and documentation complete.  IV removed.  Kizzie Bane, RN

## 2013-09-05 DIAGNOSIS — G8918 Other acute postprocedural pain: Secondary | ICD-10-CM | POA: Diagnosis not present

## 2013-09-05 DIAGNOSIS — M8448XA Pathological fracture, other site, initial encounter for fracture: Secondary | ICD-10-CM | POA: Diagnosis not present

## 2013-09-05 DIAGNOSIS — I130 Hypertensive heart and chronic kidney disease with heart failure and stage 1 through stage 4 chronic kidney disease, or unspecified chronic kidney disease: Secondary | ICD-10-CM | POA: Diagnosis not present

## 2013-09-05 DIAGNOSIS — M5106 Intervertebral disc disorders with myelopathy, lumbar region: Secondary | ICD-10-CM | POA: Diagnosis not present

## 2013-09-28 DIAGNOSIS — N189 Chronic kidney disease, unspecified: Secondary | ICD-10-CM | POA: Diagnosis not present

## 2013-09-28 DIAGNOSIS — Z5189 Encounter for other specified aftercare: Secondary | ICD-10-CM | POA: Diagnosis not present

## 2013-09-28 DIAGNOSIS — R262 Difficulty in walking, not elsewhere classified: Secondary | ICD-10-CM | POA: Diagnosis not present

## 2013-09-28 DIAGNOSIS — M6281 Muscle weakness (generalized): Secondary | ICD-10-CM | POA: Diagnosis not present

## 2013-09-28 DIAGNOSIS — I129 Hypertensive chronic kidney disease with stage 1 through stage 4 chronic kidney disease, or unspecified chronic kidney disease: Secondary | ICD-10-CM | POA: Diagnosis not present

## 2013-09-30 DIAGNOSIS — L03119 Cellulitis of unspecified part of limb: Secondary | ICD-10-CM | POA: Diagnosis not present

## 2013-09-30 DIAGNOSIS — R918 Other nonspecific abnormal finding of lung field: Secondary | ICD-10-CM | POA: Diagnosis not present

## 2013-09-30 DIAGNOSIS — R262 Difficulty in walking, not elsewhere classified: Secondary | ICD-10-CM | POA: Diagnosis not present

## 2013-09-30 DIAGNOSIS — Z5189 Encounter for other specified aftercare: Secondary | ICD-10-CM | POA: Diagnosis not present

## 2013-09-30 DIAGNOSIS — I82409 Acute embolism and thrombosis of unspecified deep veins of unspecified lower extremity: Secondary | ICD-10-CM | POA: Diagnosis not present

## 2013-09-30 DIAGNOSIS — L02419 Cutaneous abscess of limb, unspecified: Secondary | ICD-10-CM | POA: Diagnosis not present

## 2013-09-30 DIAGNOSIS — N189 Chronic kidney disease, unspecified: Secondary | ICD-10-CM | POA: Diagnosis not present

## 2013-09-30 DIAGNOSIS — I10 Essential (primary) hypertension: Secondary | ICD-10-CM | POA: Diagnosis not present

## 2013-09-30 DIAGNOSIS — M79609 Pain in unspecified limb: Secondary | ICD-10-CM | POA: Diagnosis not present

## 2013-09-30 DIAGNOSIS — R609 Edema, unspecified: Secondary | ICD-10-CM | POA: Diagnosis not present

## 2013-09-30 DIAGNOSIS — R911 Solitary pulmonary nodule: Secondary | ICD-10-CM | POA: Diagnosis not present

## 2013-09-30 DIAGNOSIS — Z79899 Other long term (current) drug therapy: Secondary | ICD-10-CM | POA: Diagnosis not present

## 2013-09-30 DIAGNOSIS — L0291 Cutaneous abscess, unspecified: Secondary | ICD-10-CM | POA: Diagnosis not present

## 2013-09-30 DIAGNOSIS — E119 Type 2 diabetes mellitus without complications: Secondary | ICD-10-CM | POA: Diagnosis not present

## 2013-09-30 DIAGNOSIS — I129 Hypertensive chronic kidney disease with stage 1 through stage 4 chronic kidney disease, or unspecified chronic kidney disease: Secondary | ICD-10-CM | POA: Diagnosis not present

## 2013-09-30 DIAGNOSIS — M6281 Muscle weakness (generalized): Secondary | ICD-10-CM | POA: Diagnosis not present

## 2013-09-30 DIAGNOSIS — I824Z9 Acute embolism and thrombosis of unspecified deep veins of unspecified distal lower extremity: Secondary | ICD-10-CM | POA: Diagnosis not present

## 2013-09-30 DIAGNOSIS — I824Y9 Acute embolism and thrombosis of unspecified deep veins of unspecified proximal lower extremity: Secondary | ICD-10-CM | POA: Diagnosis not present

## 2013-09-30 DIAGNOSIS — L0390 Cellulitis, unspecified: Secondary | ICD-10-CM | POA: Diagnosis not present

## 2013-09-30 DIAGNOSIS — K219 Gastro-esophageal reflux disease without esophagitis: Secondary | ICD-10-CM | POA: Diagnosis not present

## 2013-10-03 ENCOUNTER — Encounter (HOSPITAL_COMMUNITY): Payer: Self-pay | Admitting: General Practice

## 2013-10-03 ENCOUNTER — Inpatient Hospital Stay (HOSPITAL_COMMUNITY)
Admission: AD | Admit: 2013-10-03 | Discharge: 2013-10-05 | DRG: 300 | Disposition: A | Discharge status: Home Health | Payer: Medicare Other | Source: Ambulatory Visit | Attending: Neurosurgery | Admitting: Neurosurgery

## 2013-10-03 DIAGNOSIS — Z7982 Long term (current) use of aspirin: Secondary | ICD-10-CM

## 2013-10-03 DIAGNOSIS — Z981 Arthrodesis status: Secondary | ICD-10-CM

## 2013-10-03 DIAGNOSIS — I10 Essential (primary) hypertension: Secondary | ICD-10-CM | POA: Diagnosis present

## 2013-10-03 DIAGNOSIS — Z7901 Long term (current) use of anticoagulants: Secondary | ICD-10-CM

## 2013-10-03 DIAGNOSIS — N189 Chronic kidney disease, unspecified: Secondary | ICD-10-CM | POA: Diagnosis not present

## 2013-10-03 DIAGNOSIS — R262 Difficulty in walking, not elsewhere classified: Secondary | ICD-10-CM | POA: Diagnosis not present

## 2013-10-03 DIAGNOSIS — M6281 Muscle weakness (generalized): Secondary | ICD-10-CM | POA: Diagnosis not present

## 2013-10-03 DIAGNOSIS — Z79899 Other long term (current) drug therapy: Secondary | ICD-10-CM

## 2013-10-03 DIAGNOSIS — I82402 Acute embolism and thrombosis of unspecified deep veins of left lower extremity: Secondary | ICD-10-CM

## 2013-10-03 DIAGNOSIS — M545 Low back pain, unspecified: Secondary | ICD-10-CM | POA: Diagnosis not present

## 2013-10-03 DIAGNOSIS — L03119 Cellulitis of unspecified part of limb: Secondary | ICD-10-CM | POA: Diagnosis present

## 2013-10-03 DIAGNOSIS — K219 Gastro-esophageal reflux disease without esophagitis: Secondary | ICD-10-CM | POA: Diagnosis present

## 2013-10-03 DIAGNOSIS — H409 Unspecified glaucoma: Secondary | ICD-10-CM | POA: Diagnosis present

## 2013-10-03 DIAGNOSIS — L02419 Cutaneous abscess of limb, unspecified: Secondary | ICD-10-CM | POA: Diagnosis present

## 2013-10-03 DIAGNOSIS — E860 Dehydration: Secondary | ICD-10-CM | POA: Diagnosis present

## 2013-10-03 DIAGNOSIS — E119 Type 2 diabetes mellitus without complications: Secondary | ICD-10-CM | POA: Diagnosis present

## 2013-10-03 DIAGNOSIS — Z9851 Tubal ligation status: Secondary | ICD-10-CM

## 2013-10-03 DIAGNOSIS — IMO0002 Reserved for concepts with insufficient information to code with codable children: Secondary | ICD-10-CM

## 2013-10-03 DIAGNOSIS — Z880 Allergy status to penicillin: Secondary | ICD-10-CM | POA: Diagnosis not present

## 2013-10-03 DIAGNOSIS — I82409 Acute embolism and thrombosis of unspecified deep veins of unspecified lower extremity: Principal | ICD-10-CM | POA: Diagnosis present

## 2013-10-03 DIAGNOSIS — Z5189 Encounter for other specified aftercare: Secondary | ICD-10-CM | POA: Diagnosis not present

## 2013-10-03 DIAGNOSIS — R627 Adult failure to thrive: Secondary | ICD-10-CM | POA: Diagnosis present

## 2013-10-03 DIAGNOSIS — I129 Hypertensive chronic kidney disease with stage 1 through stage 4 chronic kidney disease, or unspecified chronic kidney disease: Secondary | ICD-10-CM | POA: Diagnosis not present

## 2013-10-03 DIAGNOSIS — M7989 Other specified soft tissue disorders: Secondary | ICD-10-CM | POA: Diagnosis not present

## 2013-10-03 HISTORY — DX: Acute embolism and thrombosis of unspecified deep veins of unspecified lower extremity: I82.409

## 2013-10-03 LAB — COMPREHENSIVE METABOLIC PANEL WITH GFR
ALT: 15 U/L (ref 0–35)
AST: 25 U/L (ref 0–37)
Albumin: 2.8 g/dL — ABNORMAL LOW (ref 3.5–5.2)
Alkaline Phosphatase: 109 U/L (ref 39–117)
Anion gap: 10 (ref 5–15)
BUN: 9 mg/dL (ref 6–23)
CO2: 28 meq/L (ref 19–32)
Calcium: 8.4 mg/dL (ref 8.4–10.5)
Chloride: 98 meq/L (ref 96–112)
Creatinine, Ser: 0.72 mg/dL (ref 0.50–1.10)
GFR calc Af Amer: >90 mL/min
GFR calc non Af Amer: 81 mL/min — ABNORMAL LOW
Glucose, Bld: 118 mg/dL — ABNORMAL HIGH (ref 70–99)
Potassium: 3.6 meq/L — ABNORMAL LOW (ref 3.7–5.3)
Sodium: 136 meq/L — ABNORMAL LOW (ref 137–147)
Total Bilirubin: 0.4 mg/dL (ref 0.3–1.2)
Total Protein: 5.8 g/dL — ABNORMAL LOW (ref 6.0–8.3)

## 2013-10-03 LAB — CBC WITH DIFFERENTIAL/PLATELET
BASOS PCT: 1 % (ref 0–1)
Basophils Absolute: 0.1 10*3/uL (ref 0.0–0.1)
Eosinophils Absolute: 0.1 10*3/uL (ref 0.0–0.7)
Eosinophils Relative: 1 % (ref 0–5)
HCT: 34.1 % — ABNORMAL LOW (ref 36.0–46.0)
HEMOGLOBIN: 11.1 g/dL — AB (ref 12.0–15.0)
LYMPHS ABS: 0.8 10*3/uL (ref 0.7–4.0)
Lymphocytes Relative: 9 % — ABNORMAL LOW (ref 12–46)
MCH: 33.4 pg (ref 26.0–34.0)
MCHC: 32.6 g/dL (ref 30.0–36.0)
MCV: 102.7 fL — ABNORMAL HIGH (ref 78.0–100.0)
MONOS PCT: 6 % (ref 3–12)
Monocytes Absolute: 0.5 10*3/uL (ref 0.1–1.0)
NEUTROS ABS: 7.5 10*3/uL (ref 1.7–7.7)
Neutrophils Relative %: 83 % — ABNORMAL HIGH (ref 43–77)
Platelets: 325 10*3/uL (ref 150–400)
RBC: 3.32 MIL/uL — AB (ref 3.87–5.11)
RDW: 14.4 % (ref 11.5–15.5)
WBC: 9 10*3/uL (ref 4.0–10.5)

## 2013-10-03 MED ORDER — POTASSIUM CHLORIDE 2 MEQ/ML IV SOLN
INTRAVENOUS | Status: DC
  Administered 2013-10-03 – 2013-10-05 (×4): via INTRAVENOUS
  Filled 2013-10-03 (×5): qty 1000

## 2013-10-03 MED ORDER — DOCUSATE SODIUM 100 MG PO CAPS
100.0000 mg | ORAL_CAPSULE | Freq: Two times a day (BID) | ORAL | Status: DC
  Administered 2013-10-03 – 2013-10-05 (×4): 100 mg via ORAL
  Filled 2013-10-03 (×5): qty 1

## 2013-10-03 MED ORDER — PANTOPRAZOLE SODIUM 40 MG PO TBEC
40.0000 mg | DELAYED_RELEASE_TABLET | Freq: Every day | ORAL | Status: DC
Start: 2013-10-04 — End: 2013-10-05
  Administered 2013-10-04 – 2013-10-05 (×2): 40 mg via ORAL
  Filled 2013-10-03 (×2): qty 1

## 2013-10-03 MED ORDER — OXYCODONE-ACETAMINOPHEN 5-325 MG PO TABS
1.0000 | ORAL_TABLET | ORAL | Status: DC | PRN
  Administered 2013-10-04: 1 via ORAL
  Filled 2013-10-03: qty 1

## 2013-10-03 MED ORDER — ACETAMINOPHEN 650 MG RE SUPP: 650.0000 mg | Freq: Four times a day (QID) | RECTAL | Status: DC | PRN

## 2013-10-03 MED ORDER — ASPIRIN EC 81 MG PO TBEC
81.0000 mg | DELAYED_RELEASE_TABLET | Freq: Every day | ORAL | Status: DC
  Administered 2013-10-03 – 2013-10-04 (×2): 81 mg via ORAL
  Filled 2013-10-03 (×3): qty 1

## 2013-10-03 MED ORDER — TIMOLOL MALEATE 0.5 % OP SOLN
1.0000 [drp] | Freq: Two times a day (BID) | OPHTHALMIC | Status: DC
  Administered 2013-10-03 – 2013-10-05 (×4): 1 [drp] via OPHTHALMIC
  Filled 2013-10-03: qty 5

## 2013-10-03 MED ORDER — ALUM & MAG HYDROXIDE-SIMETH 200-200-20 MG/5ML PO SUSP: 30.0000 mL | Freq: Four times a day (QID) | ORAL | Status: DC | PRN

## 2013-10-03 MED ORDER — ACETAMINOPHEN 325 MG PO TABS: 650.0000 mg | ORAL_TABLET | Freq: Four times a day (QID) | ORAL | Status: DC | PRN

## 2013-10-03 MED ORDER — ONDANSETRON HCL 4 MG PO TABS: 4.0000 mg | ORAL_TABLET | Freq: Four times a day (QID) | ORAL | Status: DC | PRN

## 2013-10-03 MED ORDER — POTASSIUM CHLORIDE CRYS ER 10 MEQ PO TBCR
10.0000 meq | EXTENDED_RELEASE_TABLET | Freq: Every day | ORAL | Status: DC
  Administered 2013-10-04 – 2013-10-05 (×2): 10 meq via ORAL
  Filled 2013-10-03 (×3): qty 1

## 2013-10-03 MED ORDER — ONDANSETRON HCL 4 MG/2ML IJ SOLN: 4.0000 mg | Freq: Four times a day (QID) | INTRAMUSCULAR | Status: DC | PRN

## 2013-10-03 MED ORDER — DIAZEPAM 5 MG PO TABS: 5.0000 mg | ORAL_TABLET | Freq: Four times a day (QID) | ORAL | Status: DC | PRN

## 2013-10-03 MED ORDER — PREDNISONE 10 MG PO TABS
10.0000 mg | ORAL_TABLET | Freq: Every day | ORAL | Status: DC
  Administered 2013-10-04 – 2013-10-05 (×2): 10 mg via ORAL
  Filled 2013-10-03 (×3): qty 1

## 2013-10-03 MED ORDER — FUROSEMIDE 80 MG PO TABS
80.0000 mg | ORAL_TABLET | Freq: Every evening | ORAL | Status: DC
  Administered 2013-10-03 – 2013-10-04 (×2): 80 mg via ORAL
  Filled 2013-10-03 (×3): qty 1

## 2013-10-03 MED ORDER — MORPHINE SULFATE 2 MG/ML IJ SOLN: 2.0000 mg | INTRAMUSCULAR | Status: DC | PRN

## 2013-10-03 MED ORDER — ENOXAPARIN SODIUM 80 MG/0.8ML ~~LOC~~ SOLN
80.0000 mg | Freq: Two times a day (BID) | SUBCUTANEOUS | Status: DC
  Administered 2013-10-03 – 2013-10-05 (×4): 80 mg via SUBCUTANEOUS
  Filled 2013-10-03 (×5): qty 0.8

## 2013-10-03 MED ORDER — ENOXAPARIN SODIUM 40 MG/0.4ML ~~LOC~~ SOLN
40.0000 mg | SUBCUTANEOUS | Status: DC
Start: 2013-10-03 — End: 2013-10-03

## 2013-10-03 MED ORDER — VERAPAMIL HCL ER 240 MG PO TBCR
240.0000 mg | EXTENDED_RELEASE_TABLET | Freq: Every day | ORAL | Status: DC
  Administered 2013-10-03 – 2013-10-04 (×2): 240 mg via ORAL
  Filled 2013-10-03 (×3): qty 1

## 2013-10-03 NOTE — Progress Notes (Signed)
ANTICOAGULATION CONSULT NOTE - Initial Consult  Pharmacy Consult for lovenox  Indication: recent DVT  Allergies  Allergen Reactions  . Penicillins Swelling    Tolerated Ancef 08/29/13, tdd    Patient Measurements: Height: 5' (152.4 cm) Weight: 178 lb 12.8 oz (81.103 kg) IBW/kg (Calculated) : 45.5 Heparin Dosing Weight:   Vital Signs: Temp: 98.6 F (37 C) (07/27 1350) Temp src: Oral (07/27 1350) BP: 135/79 mmHg (07/27 1350) Pulse Rate: 88 (07/27 1350)  Labs:  Recent Labs  10/03/13 1520  HGB 11.1*  HCT 34.1*  PLT 325  CREATININE 0.72    Estimated Creatinine Clearance: 55.5 ml/min (by C-G formula based on Cr of 0.72).   Medical History: Past Medical History  Diagnosis Date  . Diabetes mellitus without complication     prediabetes- HgbA1C- has been monitored  by PCP- Dr. Nicki Reaper- [facility]   . Complication of anesthesia   . PONV (postoperative nausea and vomiting)     nausea  . Hypertension   . Pneumonia     hosp. many yrs. ago for pneumonia  . GERD (gastroesophageal reflux disease)   . Arthritis     + lumbar stenosis  . Blood dyscrasia     rare blood disease - since she was 53 yrs. old that has lead to chest/lung scarring .  Marland Kitchen Apical lung scarring     lung scarring related to disease that was diagnosed when she was 79 y.o.   . 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    . Glaucoma     left eye only  . DVT (deep venous thrombosis) 10/03/2013    LEFT LEG    Medications:  Scheduled:  . aspirin  81 mg Oral QHS  . docusate sodium  100 mg Oral BID  . enoxaparin (LOVENOX) injection  80 mg Subcutaneous Q12H  . furosemide  80 mg Oral QPM  . pantoprazole  40 mg Oral Daily  . potassium chloride  10 mEq Oral Daily  . [START ON 10/04/2013] predniSONE  10 mg Oral Q breakfast  . timolol  1 drop Left Eye BID  . verapamil  240 mg Oral QHS   Infusions:  . lactated ringers with kcl      Assessment: 79 yo female with  recent diagnosed DVT will be continued on lovenox therapy that she has been getting at home.  Last dose per patient was ~10am on 10/03/13.  CrCl ~55 and weighs ~80 kg. Goal of Therapy:  Anti-Xa level 0.6-1 units/ml 4hrs after LMWH dose given Monitor platelets by anticoagulation protocol: Yes   Plan:  1) Start lovenox at 80 mg sq q12h, 1st dose at 2200 tonight 2) CBC every 72 hours  Mery Guadalupe, Tsz-Yin 10/03/2013,4:41 PM

## 2013-10-03 NOTE — H&P (Signed)
Subjective: The patient is a 79 year old white  female on whom I performed a L3 to L5 fusion on 08/29/13.  The patient initially did well.  She was transferred to a rehabilitation facility where she again initially seen to be doing well.  The patient's progress stalled and she developed swelling in her legs.  She was seen at the [facility] emergency department.  A chest CT rule out pulmonary embolism.  She was diagnosed with a deep venous thrombosis and lower extremity cellulitis.  She was started on Lovenox and antibiotics. She was transported to her home via EMS.  The patient followed up with me in the office today.  The patient has had decreased by mouth intake.  She has had continued leg swelling and redness.  Past Medical History  Diagnosis Date  . Diabetes mellitus without complication     prediabetes- HgbA1C- has been monitored  by PCP- Dr. Nicki Reaper- [facility]   . Complication of anesthesia   . PONV (postoperative nausea and vomiting)     nausea  . Hypertension   . Pneumonia     hosp. many yrs. ago for pneumonia  . GERD (gastroesophageal reflux disease)   . Arthritis     + lumbar stenosis  . Blood dyscrasia     rare blood disease - since she was 89 yrs. old that has lead to chest/lung scarring .  Marland Kitchen Apical lung scarring     lung scarring related to disease that was diagnosed when she was 79 y.o.   . 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    . Glaucoma     left eye only  . DVT (deep venous thrombosis) 10/03/2013    LEFT LEG    Past Surgical History  Procedure Laterality Date  . Eye surgery Bilateral     IOL only in R eye  . Tubal ligation    . Posterior fusion lumbar spine  08/29/2013    LEVEL 2     Allergies  Allergen Reactions  . Penicillins Swelling    Tolerated Ancef 08/29/13, tdd    History  Substance Use Topics  . Smoking status: Never Smoker   . Smokeless tobacco: Never Used  . Alcohol Use: No    History  reviewed. No pertinent family history. Prior to Admission medications   Medication Sig Start Date End Date Taking? Authorizing Provider  aspirin 81 MG tablet Take 81 mg by mouth at bedtime.   Yes Historical Provider, MD  Calcium Carbonate-Vitamin D (CALCIUM-VITAMIN D) 500-200 MG-UNIT per tablet Take 1 tablet by mouth 2 (two) times daily.   Yes Historical Provider, MD  co-enzyme Q-10 30 MG capsule Take 30 mg by mouth daily.   Yes Historical Provider, MD  enoxaparin (LOVENOX) 80 MG/0.8ML injection Inject 75 mg into the skin every 12 (twelve) hours.   Yes Historical Provider, MD  furosemide (LASIX) 40 MG tablet Take 80 mg by mouth every evening.   Yes Historical Provider, MD  omeprazole (PRILOSEC) 20 MG capsule Take 20 mg by mouth daily.   Yes Historical Provider, MD  oxyCODONE-acetaminophen (PERCOCET/ROXICET) 5-325 MG per tablet Take 1-2 tablets by mouth every 4 (four) hours as needed for moderate pain. 09/02/13  Yes Ophelia Charter, MD  potassium chloride (MICRO-K) 10 MEQ CR capsule Take 30 mEq by mouth 2 (two) times daily.   Yes Historical Provider, MD  predniSONE (DELTASONE) 10 MG tablet Take 10 mg by mouth daily with breakfast.  Yes Historical Provider, MD  timolol (BETIMOL) 0.5 % ophthalmic solution Place 1 drop into the left eye 2 (two) times daily.   Yes Historical Provider, MD  verapamil (CALAN-SR) 240 MG CR tablet Take 240 mg by mouth at bedtime.   Yes Historical Provider, MD     Review of Systems  Positive ROS: as above  All other systems have been reviewed and were otherwise negative with the exception of those mentioned in the HPI and as above.  Objective: Vital signs in last 24 hours: Temp:  [98.6 F (37 C)] 98.6 F (37 C) (07/27 1350) Pulse Rate:  [88] 88 (07/27 1350) Resp:  [16] 16 (07/27 1350) BP: (135)/(79) 135/79 mmHg (07/27 1350) SpO2:  [94 %] 94 % (07/27 1350) Weight:  [81.103 kg (178 lb 12.8 oz)] 81.103 kg (178 lb 12.8 oz) (07/27 1350)  General Appearance: Alert,  cooperative, she is at times somnolent Head: Normocephalic, without obvious abnormality, atraumatic Eyes: PERRL, conjunctiva/corneas clear, EOM's intact,    Abdomen: Protuberant,  Thorax: Symmetric NEUROLOGIC:   Mental status: alert and oriented, no aphasia, good attention span, Fund of knowledge/ memory ok Motor Exam - grossly normal Sensory Exam - grossly normal Reflexes:  Coordination -not testedGait -not tested Cranial Nerves:grossly normal Data Review Lab Results  Component Value Date   WBC 9.0 10/03/2013   HGB 11.1* 10/03/2013   HCT 34.1* 10/03/2013   MCV 102.7* 10/03/2013   PLT 325 10/03/2013   Lab Results  Component Value Date   NA 136* 10/03/2013   K 3.6* 10/03/2013   CL 98 10/03/2013   CO2 28 10/03/2013   BUN 9 10/03/2013   CREATININE 0.72 10/03/2013   GLUCOSE 118* 10/03/2013   No results found for this basename: INR, PROTIME    Assessment/Plan: Deep venous thrombosis, lower extremity cellulitis, dehydration: I have discussed the situation with  The patient and her family.  I am going to admit her for IV hydration, antibiotics, etc.   Kendricks Reap D 10/03/2013 7:25 PM

## 2013-10-03 NOTE — Progress Notes (Addendum)
Pt admitted to the unit at 1340. Pt mental status is A&Ox4. Pt oriented to room, staff, and call bell. Skin is intact except for heling incision lumbar back as charted and edematous LLE.. Full assessment charted in CHL. Call bell within reach. Visitor guidelines reviewed w/ pt and/or family.

## 2013-10-04 LAB — CBC
HEMATOCRIT: 32.1 % — AB (ref 36.0–46.0)
HEMOGLOBIN: 10.2 g/dL — AB (ref 12.0–15.0)
MCH: 32.6 pg (ref 26.0–34.0)
MCHC: 31.8 g/dL (ref 30.0–36.0)
MCV: 102.6 fL — AB (ref 78.0–100.0)
Platelets: 313 10*3/uL (ref 150–400)
RBC: 3.13 MIL/uL — ABNORMAL LOW (ref 3.87–5.11)
RDW: 14.9 % (ref 11.5–15.5)
WBC: 9.8 10*3/uL (ref 4.0–10.5)

## 2013-10-04 NOTE — Progress Notes (Signed)
Patient is min guard assist on PT evaluation and anticipate that she should progress to supervision level with therapy on acute over next 1-2 days. Agree with HHPT for follow up past discharge. Will defer CIR consult for now

## 2013-10-04 NOTE — Progress Notes (Signed)
Patient ID: Monica Evans, female   DOB: [birth year], 79 y.o.   MRN: [id number] Subjective:  The patient is alert and pleasant. She is in no apparent distress.  Objective: Vital signs in last 24 hours: Temp:  [98.4 F (36.9 C)-98.6 F (37 C)] 98.4 F (36.9 C) (07/28 0458) Pulse Rate:  [62-93] 62 (07/28 0458) Resp:  [16] 16 (07/28 0458) BP: (103-147)/(62-79) 103/62 mmHg (07/28 0458) SpO2:  [94 %-95 %] 94 % (07/28 0458) Weight:  [81.103 kg (178 lb 12.8 oz)] 81.103 kg (178 lb 12.8 oz) (07/27 1350)  Intake/Output from previous day: 07/27 0701 - 07/28 0700 In: 240 [P.O.:240] Out: -  Intake/Output this shift:    Physical exam the patient is alert and pleasant. She is moving her lower extremities well.  Lab Results:  Recent Labs  10/03/13 1520 10/04/13 0600  WBC 9.0 9.8  HGB 11.1* 10.2*  HCT 34.1* 32.1*  PLT 325 313   BMET  Recent Labs  10/03/13 1520  NA 136*  K 3.6*  CL 98  CO2 28  GLUCOSE 118*  BUN 9  CREATININE 0.72  CALCIUM 8.4    Studies/Results: No results found.  Assessment/Plan: Status post lumbar fusion: I will asked the rehabilitation team see the patient to see if she needs inpatient rehabilitation.  DVT: She is on Lovenox  Cellulitis: I will asked the hospitalist to help was with this.  LOS: 1 day     Monica Evans D 10/04/2013, 7:37 AM

## 2013-10-04 NOTE — Progress Notes (Signed)
Thank you for consult on Ms. Monica Evans. Note that she was admitted last pm with DVT and PT to evaluate today. Will follow u past evaluations to help determine best rehab venue.

## 2013-10-04 NOTE — Evaluation (Signed)
Physical Therapy Evaluation Patient Details Name: Monica Evans MRN: [id number] DOB: 01-Feb-1935 Today's Date: 10/04/2013   History of Present Illness  Patient is a 79 y/o female admitted with DVT LLE and cellulitis s/p PLIF for spinal stenosis and L4 fx on 08/29/13. Pt recently d/ced from [place] rehab facility. PMH positive for DM, HTN, and PNA.   Clinical Impression  Patient presents with functional limitations due to deficits listed in PT problem list below. Pt presents with impaired mobility especially with transfers and ambulation secondary to weakness and balance deficits. Required physical assist donning back brace and with transfers today. Pt not safe to be home alone at this time as pt high fall risk. Pending improvement, pt may need ST SNF rehab prior to returning home; pt agreeable. Pt would benefit from skilled PT to maximize independence and improve safe mobility so pt can safely discharge to below venue.    Follow Up Recommendations Home health PT;Supervision/Assistance - 24 hour;SNF (If family not able to provide 24/7 supervision, recommend ST SNF. Pt agrees. )    Equipment Recommendations  None recommended by PT    Recommendations for Other Services       Precautions / Restrictions Precautions Precautions: Fall;Back Precaution Booklet Issued: No Required Braces or Orthoses: Spinal Brace Spinal Brace: Applied in sitting position;Lumbar corset Restrictions Weight Bearing Restrictions: No      Mobility  Bed Mobility Overal bed mobility: Needs Assistance Bed Mobility: Rolling;Sidelying to Sit Rolling: Min guard Sidelying to sit: Mod assist;HOB elevated       General bed mobility comments: Required assist with trunk to sit upright. VC for log roll technique and safey with transfer. Increased time.  Transfers Overall transfer level: Needs assistance Equipment used: Rolling walker (2 wheeled) Transfers: Sit to/from [facility] Sit to Stand: Min  guard;Min assist Stand pivot transfers: Min guard       General transfer comment: Stood from EOB x2 with RW, increased time and difficulty standing from standard bed height requiring Min A and VC for technique/hand placement; able to stand from elevated bed height with Min guard and RW. SPT bed<->BSC Min guard for safety. Uncontrolled descent into chair post gait training.  Ambulation/Gait Ambulation/Gait assistance: Min guard Ambulation Distance (Feet): 150 Feet Assistive device: Rolling walker (2 wheeled) Gait Pattern/deviations: Step-through pattern;Decreased stance time - right;Decreased stride length;Trunk flexed;Decreased step length - left Gait velocity: Decreased Gait velocity interpretation: Below normal speed for age/gender General Gait Details: Unsteady during ambulation. A few short standing rest breaks. SOB present.  Stairs            Wheelchair Mobility    Modified Rankin (Stroke Patients Only)       Balance Overall balance assessment: Needs assistance   Sitting balance-Leahy Scale: Fair Sitting balance - Comments: Required assist donning back brace sitting EOB. Assist donning shoes.   Standing balance support: During functional activity;Bilateral upper extremity supported Standing balance-Leahy Scale: Poor Standing balance comment: Use of RW for support.                             Pertinent Vitals/Pain Reports increased pain in LLE with ambulation. Not rated on pain scale however pt repositioned for comfort post treatment. SOB present during ambulation however vitals remained stable.     Home Living Family/patient expects to be discharged to:: Private residence Living Arrangements: Alone Available Help at Discharge: Family;Available PRN/intermittently Type of Home: House Home Access: Ramped entrance  Home Layout: Two level;Able to live on main level with bedroom/bathroom Home Equipment: Gilford Rile - 2 wheels;Wheelchair - manual;Cane -  single point;Bedside commode;Toilet riser      Prior Function Level of Independence: Needs assistance   Gait / Transfers Assistance Needed: Uses RW for ambulation. Getting HHPT prior to admission.   ADL's / Homemaking Assistance Needed: Requires assist with transferring into/out of tub and with bathing.  Comments: Has not been driving since surgery. Family assists with IADLs.     Hand Dominance   Dominant Hand: Right    Extremity/Trunk Assessment   Upper Extremity Assessment: Overall WFL for tasks assessed           Lower Extremity Assessment: Generalized weakness;LLE deficits/detail   LLE Deficits / Details: Swelling, erythema and redness present distal LE.     Communication   Communication: No difficulties  Cognition Arousal/Alertness: Awake/alert Behavior During Therapy: WFL for tasks assessed/performed Overall Cognitive Status: Within Functional Limits for tasks assessed                      General Comments General comments (skin integrity, edema, etc.): Redness, erythema and warmth present LLE distal to knee.    Exercises        Assessment/Plan    PT Assessment Patient needs continued PT services  PT Diagnosis Generalized weakness   PT Problem List Decreased strength;Cardiopulmonary status limiting activity;Pain;Decreased activity tolerance;Decreased balance;Decreased mobility;Decreased safety awareness;Decreased skin integrity  PT Treatment Interventions Balance training;Gait training;Functional mobility training;Therapeutic activities;Therapeutic exercise;Patient/family education   PT Goals (Current goals can be found in the Care Plan section) Acute Rehab PT Goals Patient Stated Goal: to make this blood clot go away. PT Goal Formulation: With patient Time For Goal Achievement: 10/18/13 Potential to Achieve Goals: Good    Frequency Min 3X/week   Barriers to discharge Decreased caregiver support      Co-evaluation                End of Session Equipment Utilized During Treatment: Gait belt;Back brace Activity Tolerance: Patient limited by fatigue Patient left: in chair;with call bell/phone within reach;with family/visitor present (Informed family to notify RN when they leave as pt does not have chair alarm set.) Nurse Communication: Mobility status;Precautions         Time: 9767-3419 PT Time Calculation (min): 41 min   Charges:   PT Evaluation $Initial PT Evaluation Tier I: 1 Procedure PT Treatments $Gait Training: 8-22 mins $Therapeutic Activity: 8-22 mins   PT G CodesCandy Sledge A 10/04/2013, 11:52 AM Candy Sledge, [place], DPT [phone]

## 2013-10-04 NOTE — Progress Notes (Signed)
Utilization review completed.  

## 2013-10-05 MED ORDER — FUROSEMIDE 80 MG PO TABS
80.0000 mg | ORAL_TABLET | ORAL | Status: DC
  Administered 2013-10-05: 80 mg via ORAL
  Filled 2013-10-05: qty 1

## 2013-10-05 NOTE — Progress Notes (Signed)
Nsg Discharge Note  Admit Date:  10/03/2013 Discharge date: 10/05/2013   Monica Evans to be D/C'd Home with home health care.  per MD order.  AVS completed.  Copy for chart, and copy for patient signed, and dated. Patient/caregiver able to verbalize understanding.  Discharge Medication:   Medication List    STOP taking these medications       omeprazole 20 MG capsule  Commonly known as:  PRILOSEC     oxyCODONE-acetaminophen 5-325 MG per tablet  Commonly known as:  PERCOCET/ROXICET      TAKE these medications       aspirin 81 MG tablet  Take 81 mg by mouth at bedtime.     calcium-vitamin D 500-200 MG-UNIT per tablet  Take 1 tablet by mouth 2 (two) times daily.     co-enzyme Q-10 30 MG capsule  Take 30 mg by mouth daily.     enoxaparin 80 MG/0.8ML injection  Commonly known as:  LOVENOX  Inject 75 mg into the skin every 12 (twelve) hours.     furosemide 40 MG tablet  Commonly known as:  LASIX  Take 80 mg by mouth every evening.     potassium chloride 10 MEQ CR capsule  Commonly known as:  MICRO-K  Take 30 mEq by mouth 2 (two) times daily.     predniSONE 10 MG tablet  Commonly known as:  DELTASONE  Take 10 mg by mouth daily with breakfast.     timolol 0.5 % ophthalmic solution  Commonly known as:  BETIMOL  Place 1 drop into the left eye 2 (two) times daily.     verapamil 240 MG CR tablet  Commonly known as:  CALAN-SR  Take 240 mg by mouth at bedtime.        Discharge Assessment: Filed Vitals:   10/05/13 1318  BP: 122/74  Pulse: 75  Temp: 97.8 F (36.6 C)  Resp: 18   Skin clean, dry and intact without evidence of skin break down, no evidence of skin tears noted. Surgical scar to back noted.  IV catheter discontinued intact. Site without signs and symptoms of complications - no redness or edema noted at insertion site, patient denies c/o pain - only slight tenderness at site.  Dressing with slight pressure applied.  D/c  Instructions-Education: Discharge instructions given to patient/family with verbalized understanding. D/c education completed with patient/family including follow up instructions, medication list, d/c activities limitations if indicated, with other d/c instructions as indicated by MD - patient able to verbalize understanding, all questions fully answered. Patient instructed to return to ED, call 911, or call MD for any changes in condition.  Patient escorted via [place], and D/C home via private auto.  Dayle Points, RN 10/05/2013 3:08 PM

## 2013-10-05 NOTE — Care Management Note (Addendum)
    Page 1 of 2   10/05/2013     2:38:32 PM CARE MANAGEMENT NOTE 10/05/2013  Patient:  Monica Evans, Monica Evans   Account Number:  [id number]  Date Initiated:  10/05/2013  Documentation initiated by:  Tomi Bamberger  Subjective/Objective Assessment:   dx dvt  admit- lives alone, patient states daugher will be staying with her 24 hrs /day  Patient is active with [facility] for [place].     Action/Plan:   pt eval- hhpt with 24 hrs vs snf.   Anticipated DC Date:  10/05/2013   Anticipated DC Plan:  [place]  CM consult      Choice offered to / List presented to:          [facility] arranged  HH-2 PT  HH-1 RN  HH-3 OT  [place]      [place] agency  [place]   Status of service:  Completed, signed off Medicare Important Message given?  NA - LOS <3 / Initial given by admissions (If response is "NO", the following Medicare IM given date fields will be blank) Date Medicare IM given:   Medicare IM given by:   Date Additional Medicare IM given:   Additional Medicare IM given by:    Discharge Disposition:  [place]  Per UR Regulation:  Reviewed for med. necessity/level of care/duration of stay  If discussed at [place] of Stay Meetings, dates discussed:    Comments:  10/05/13 [place], BSN [phone] patient states her daughter works 3rd shift and she does not have no one to stay with her for 24 hrs, per MD this is an unsafe dc to home alone , patient will need snf.   CSW aware.  NCM spoke with daughter, Monica Evans, and she states between her and her cousins and other family members they will have 24 hr care for patient and Monica Evans states they would like to [facility] with [facility] Patient is active with  [facility] for hhpt already.  NCM faxed orders and notes to [facility] [place] will begin 24-48 hrs post dc.

## 2013-10-05 NOTE — Discharge Summary (Signed)
Physician Discharge Summary  Patient ID: Monica Evans MRN: [id number] DOB/AGE: [birth year] 79 y.o.  Admit date: 10/03/2013 Discharge date: 10/05/2013  Admission Diagnoses: Dehydration, deep venous thrombosis, failure to thrive  Discharge Diagnoses: The same Active Problems:   DVT (deep venous thrombosis)   Discharged Condition: fair  Hospital Course: I admitted the patient to [facility] on 10/03/2013 with a diagnosis of a deep venous thrombosis and dehydration. The patient was continued on Lovenox. We had PT and OT see the patient. She was hydrated. I had the rehabilitation team see the patient. They did not feel she needed inpatient rehabilitation. We initiated skilled nursing facility placement but the patient subsequently declined.  On 10/05/2013 patient felt better. Arrangements were made for home PT OT, et cetera. The patient, and her daughter, requested discharge to home. I gave them oral and written discharge instructions. She was instructed to followup with her primary doctor regarding her the venous thrombosis and to be started on oral anticoagulants. In the meantime she is to continue her Lovenox. This was all discussed with the patient and her daughter.  Consults: None Significant Diagnostic Studies: None Treatments: IV hydration, anticoagulation Discharge Exam: Blood pressure 122/74, pulse 75, temperature 97.8 F (36.6 C), temperature source Oral, resp. rate 18, height 5' (1.524 m), weight 81.103 kg (178 lb 12.8 oz), SpO2 96.00%. The patient is alert and pleasant. Her strength is grossly normal in her lower extremities. Her lower extremity edema has improved significantly.  Disposition: Home  Discharge Instructions   Call MD for:  difficulty breathing, headache or visual disturbances    Complete by:  As directed      Call MD for:  extreme fatigue    Complete by:  As directed      Call MD for:  hives    Complete by:  As directed      Call MD for:  persistant  dizziness or light-headedness    Complete by:  As directed      Call MD for:  persistant nausea and vomiting    Complete by:  As directed      Call MD for:  redness, tenderness, or signs of infection (pain, swelling, redness, odor or green/yellow discharge around incision site)    Complete by:  As directed      Call MD for:  severe uncontrolled pain    Complete by:  As directed      Call MD for:  temperature >100.4    Complete by:  As directed      Diet - low sodium heart healthy    Complete by:  As directed      Discharge instructions    Complete by:  As directed   Call [phone] for a followup appointment. Take a stool softener while you are using pain medications.     Driving Restrictions    Complete by:  As directed   Do not drive for 2 weeks.     Increase activity slowly    Complete by:  As directed      Lifting restrictions    Complete by:  As directed   Do not lift more than 5 pounds. No excessive bending or twisting.     May shower / Bathe    Complete by:  As directed   He may shower after the pain she is removed 3 days after surgery. Leave the incision alone.     No dressing needed    Complete by:  As directed  Medication List    STOP taking these medications       omeprazole 20 MG capsule  Commonly known as:  PRILOSEC     oxyCODONE-acetaminophen 5-325 MG per tablet  Commonly known as:  PERCOCET/ROXICET      TAKE these medications       aspirin 81 MG tablet  Take 81 mg by mouth at bedtime.     calcium-vitamin D 500-200 MG-UNIT per tablet  Take 1 tablet by mouth 2 (two) times daily.     co-enzyme Q-10 30 MG capsule  Take 30 mg by mouth daily.     enoxaparin 80 MG/0.8ML injection  Commonly known as:  LOVENOX  Inject 75 mg into the skin every 12 (twelve) hours.     furosemide 40 MG tablet  Commonly known as:  LASIX  Take 80 mg by mouth every evening.     potassium chloride 10 MEQ CR capsule  Commonly known as:  MICRO-K  Take 30 mEq  by mouth 2 (two) times daily.     predniSONE 10 MG tablet  Commonly known as:  DELTASONE  Take 10 mg by mouth daily with breakfast.     timolol 0.5 % ophthalmic solution  Commonly known as:  BETIMOL  Place 1 drop into the left eye 2 (two) times daily.     verapamil 240 MG CR tablet  Commonly known as:  CALAN-SR  Take 240 mg by mouth at bedtime.         SignedOphelia Evans 10/05/2013, 2:28 PM

## 2013-10-05 NOTE — Clinical Social Work Psychosocial (Signed)
Clinical Social Work Department BRIEF PSYCHOSOCIAL ASSESSMENT 10/05/2013  Patient:  Monica Evans, Monica Evans     Account Number:  [id number]     Admit date:  10/03/2013  Clinical Social Worker:  Lovey Newcomer  Date/Time:  10/05/2013 02:38 PM  Referred by:  Physician  Date Referred:  10/05/2013 Referred for  SNF Placement   Other Referral:   Interview type:  Patient Other interview type:   Paitent and daughter Monica Evans interviewed.    PSYCHOSOCIAL DATA Living Status:  ALONE Admitted from facility:   Level of care:   Primary support name:  Monica Evans and Monica Evans Primary support relationship to patient:  CHILD, ADULT Degree of support available:   Support is good.    CURRENT CONCERNS Current Concerns  Post-Acute Placement   Other Concerns:    SOCIAL WORK ASSESSMENT / PLAN CSW informed by [facility] that patient does not have 24/7 supervision at home and will therefore need SNF. CSW reviewed chart and notices that patient is medically stable for DC. CSW met with patient at bedside and with daughter on phone. CSW explained to both that patient's Medicare will not cover the cost of SNF placement as Medicare requires a 3 night QUALIFYING inpatient stay before patient is eligible for SNF benefits. CSW explained that patient's options at this point would be to discharge to SNF and pay privately, or the patient could return home with 24/7 supervision and HHPT services. Patient states that she has the resources to pay privately but "I don't understand why I need to go to a place like [facility] if I have someone to stay with me. They aren't going to do anything I can't get at home." CSW inquired about patient's ability to obtain supervision. Patient and daughter Monica Evans state that the patient will be looked after by Monica Evans (neice) and daughter Monica Evans, and patient will NOT be alone at anytime. Patient appeared frustrated with how her insurance operates but has made her decision to return home. Patient will be  transported by daughter Monica Evans. CSW will sign off at this time.   Assessment/plan status:  No Further Intervention Required Other assessment/ plan:   NONE   Information/referral to community resources:   NONE    PATIENT'S/FAMILY'S RESPONSE TO PLAN OF CARE: Patient is planning to return home with HHPT and 24/7 supervision from family. CSW signing off at this time.       Monica Evans MSW, [place], [place], [phone]

## 2013-10-05 NOTE — Progress Notes (Signed)
Patient ID: Monica Evans, female   DOB: 1934/10/19, 79 y.o.   MRN: [id number] Subjective:  The patient is alert and pleasant. She looks better. She is in no apparent distress.  Objective: Vital signs in last 24 hours: Temp:  [98 F (36.7 C)-98.7 F (37.1 C)] 98 F (36.7 C) (07/29 0533) Pulse Rate:  [67-73] 73 (07/29 0533) Resp:  [18] 18 (07/29 0533) BP: (107-116)/(63-67) 107/63 mmHg (07/29 0533) SpO2:  [93 %-97 %] 93 % (07/29 0533)  Intake/Output from previous day: 07/28 0701 - 07/29 0700 In: 845 [I.V.:845] Out: -  Intake/Output this shift:    Physical exam the patient is alert and oriented she is moving her lower extremities well. Her lower extremity edema and erythema is improving. Lab Results:  Recent Labs  10/03/13 1520 10/04/13 0600  WBC 9.0 9.8  HGB 11.1* 10.2*  HCT 34.1* 32.1*  PLT 325 313   BMET  Recent Labs  10/03/13 1520  NA 136*  K 3.6*  CL 98  CO2 28  GLUCOSE 118*  BUN 9  CREATININE 0.72  CALCIUM 8.4    Studies/Results: No results found.  Assessment/Plan: The venous thrombosis, cellulitis: The patient is improving. She is ready for skilled nursing facility placement. I will asked the case manager to see the patient. She has requested a skilled nursing facility in [place].  LOS: 2 days     Jeremy Ditullio D 10/05/2013, 11:05 AM

## 2013-10-06 DIAGNOSIS — I129 Hypertensive chronic kidney disease with stage 1 through stage 4 chronic kidney disease, or unspecified chronic kidney disease: Secondary | ICD-10-CM | POA: Diagnosis not present

## 2013-10-06 DIAGNOSIS — N189 Chronic kidney disease, unspecified: Secondary | ICD-10-CM | POA: Diagnosis not present

## 2013-10-06 DIAGNOSIS — Z5189 Encounter for other specified aftercare: Secondary | ICD-10-CM | POA: Diagnosis not present

## 2013-10-06 DIAGNOSIS — M6281 Muscle weakness (generalized): Secondary | ICD-10-CM | POA: Diagnosis not present

## 2013-10-06 DIAGNOSIS — R262 Difficulty in walking, not elsewhere classified: Secondary | ICD-10-CM | POA: Diagnosis not present

## 2013-10-07 DIAGNOSIS — I82409 Acute embolism and thrombosis of unspecified deep veins of unspecified lower extremity: Secondary | ICD-10-CM | POA: Diagnosis not present

## 2013-10-10 DIAGNOSIS — R262 Difficulty in walking, not elsewhere classified: Secondary | ICD-10-CM | POA: Diagnosis not present

## 2013-10-10 DIAGNOSIS — Z5189 Encounter for other specified aftercare: Secondary | ICD-10-CM | POA: Diagnosis not present

## 2013-10-10 DIAGNOSIS — I129 Hypertensive chronic kidney disease with stage 1 through stage 4 chronic kidney disease, or unspecified chronic kidney disease: Secondary | ICD-10-CM | POA: Diagnosis not present

## 2013-10-10 DIAGNOSIS — N189 Chronic kidney disease, unspecified: Secondary | ICD-10-CM | POA: Diagnosis not present

## 2013-10-10 DIAGNOSIS — M6281 Muscle weakness (generalized): Secondary | ICD-10-CM | POA: Diagnosis not present

## 2013-10-13 DIAGNOSIS — M6281 Muscle weakness (generalized): Secondary | ICD-10-CM | POA: Diagnosis not present

## 2013-10-13 DIAGNOSIS — N189 Chronic kidney disease, unspecified: Secondary | ICD-10-CM | POA: Diagnosis not present

## 2013-10-13 DIAGNOSIS — E119 Type 2 diabetes mellitus without complications: Secondary | ICD-10-CM | POA: Diagnosis not present

## 2013-10-13 DIAGNOSIS — Z5189 Encounter for other specified aftercare: Secondary | ICD-10-CM | POA: Diagnosis not present

## 2013-10-13 DIAGNOSIS — I129 Hypertensive chronic kidney disease with stage 1 through stage 4 chronic kidney disease, or unspecified chronic kidney disease: Secondary | ICD-10-CM | POA: Diagnosis not present

## 2013-10-13 DIAGNOSIS — R262 Difficulty in walking, not elsewhere classified: Secondary | ICD-10-CM | POA: Diagnosis not present

## 2013-10-14 DIAGNOSIS — Z5189 Encounter for other specified aftercare: Secondary | ICD-10-CM | POA: Diagnosis not present

## 2013-10-14 DIAGNOSIS — M6281 Muscle weakness (generalized): Secondary | ICD-10-CM | POA: Diagnosis not present

## 2013-10-14 DIAGNOSIS — R262 Difficulty in walking, not elsewhere classified: Secondary | ICD-10-CM | POA: Diagnosis not present

## 2013-10-14 DIAGNOSIS — I129 Hypertensive chronic kidney disease with stage 1 through stage 4 chronic kidney disease, or unspecified chronic kidney disease: Secondary | ICD-10-CM | POA: Diagnosis not present

## 2013-10-14 DIAGNOSIS — N189 Chronic kidney disease, unspecified: Secondary | ICD-10-CM | POA: Diagnosis not present

## 2013-10-17 DIAGNOSIS — R262 Difficulty in walking, not elsewhere classified: Secondary | ICD-10-CM | POA: Diagnosis not present

## 2013-10-17 DIAGNOSIS — I129 Hypertensive chronic kidney disease with stage 1 through stage 4 chronic kidney disease, or unspecified chronic kidney disease: Secondary | ICD-10-CM | POA: Diagnosis not present

## 2013-10-17 DIAGNOSIS — M6281 Muscle weakness (generalized): Secondary | ICD-10-CM | POA: Diagnosis not present

## 2013-10-17 DIAGNOSIS — Z5189 Encounter for other specified aftercare: Secondary | ICD-10-CM | POA: Diagnosis not present

## 2013-10-17 DIAGNOSIS — E119 Type 2 diabetes mellitus without complications: Secondary | ICD-10-CM | POA: Diagnosis not present

## 2013-10-17 DIAGNOSIS — N189 Chronic kidney disease, unspecified: Secondary | ICD-10-CM | POA: Diagnosis not present

## 2013-10-19 DIAGNOSIS — I129 Hypertensive chronic kidney disease with stage 1 through stage 4 chronic kidney disease, or unspecified chronic kidney disease: Secondary | ICD-10-CM | POA: Diagnosis not present

## 2013-10-19 DIAGNOSIS — M6281 Muscle weakness (generalized): Secondary | ICD-10-CM | POA: Diagnosis not present

## 2013-10-19 DIAGNOSIS — N189 Chronic kidney disease, unspecified: Secondary | ICD-10-CM | POA: Diagnosis not present

## 2013-10-19 DIAGNOSIS — Z5189 Encounter for other specified aftercare: Secondary | ICD-10-CM | POA: Diagnosis not present

## 2013-10-19 DIAGNOSIS — R262 Difficulty in walking, not elsewhere classified: Secondary | ICD-10-CM | POA: Diagnosis not present

## 2013-10-20 DIAGNOSIS — N189 Chronic kidney disease, unspecified: Secondary | ICD-10-CM | POA: Diagnosis not present

## 2013-10-20 DIAGNOSIS — R262 Difficulty in walking, not elsewhere classified: Secondary | ICD-10-CM | POA: Diagnosis not present

## 2013-10-20 DIAGNOSIS — M6281 Muscle weakness (generalized): Secondary | ICD-10-CM | POA: Diagnosis not present

## 2013-10-20 DIAGNOSIS — I129 Hypertensive chronic kidney disease with stage 1 through stage 4 chronic kidney disease, or unspecified chronic kidney disease: Secondary | ICD-10-CM | POA: Diagnosis not present

## 2013-10-20 DIAGNOSIS — Z5189 Encounter for other specified aftercare: Secondary | ICD-10-CM | POA: Diagnosis not present

## 2013-10-21 DIAGNOSIS — N189 Chronic kidney disease, unspecified: Secondary | ICD-10-CM | POA: Diagnosis not present

## 2013-10-21 DIAGNOSIS — I129 Hypertensive chronic kidney disease with stage 1 through stage 4 chronic kidney disease, or unspecified chronic kidney disease: Secondary | ICD-10-CM | POA: Diagnosis not present

## 2013-10-21 DIAGNOSIS — Z5189 Encounter for other specified aftercare: Secondary | ICD-10-CM | POA: Diagnosis not present

## 2013-10-21 DIAGNOSIS — M6281 Muscle weakness (generalized): Secondary | ICD-10-CM | POA: Diagnosis not present

## 2013-10-21 DIAGNOSIS — R262 Difficulty in walking, not elsewhere classified: Secondary | ICD-10-CM | POA: Diagnosis not present

## 2013-10-24 DIAGNOSIS — N189 Chronic kidney disease, unspecified: Secondary | ICD-10-CM | POA: Diagnosis not present

## 2013-10-24 DIAGNOSIS — Z5189 Encounter for other specified aftercare: Secondary | ICD-10-CM | POA: Diagnosis not present

## 2013-10-24 DIAGNOSIS — R262 Difficulty in walking, not elsewhere classified: Secondary | ICD-10-CM | POA: Diagnosis not present

## 2013-10-24 DIAGNOSIS — I129 Hypertensive chronic kidney disease with stage 1 through stage 4 chronic kidney disease, or unspecified chronic kidney disease: Secondary | ICD-10-CM | POA: Diagnosis not present

## 2013-10-24 DIAGNOSIS — M6281 Muscle weakness (generalized): Secondary | ICD-10-CM | POA: Diagnosis not present

## 2013-10-27 DIAGNOSIS — N189 Chronic kidney disease, unspecified: Secondary | ICD-10-CM | POA: Diagnosis not present

## 2013-10-27 DIAGNOSIS — M6281 Muscle weakness (generalized): Secondary | ICD-10-CM | POA: Diagnosis not present

## 2013-10-27 DIAGNOSIS — R262 Difficulty in walking, not elsewhere classified: Secondary | ICD-10-CM | POA: Diagnosis not present

## 2013-10-27 DIAGNOSIS — I129 Hypertensive chronic kidney disease with stage 1 through stage 4 chronic kidney disease, or unspecified chronic kidney disease: Secondary | ICD-10-CM | POA: Diagnosis not present

## 2013-10-27 DIAGNOSIS — Z5189 Encounter for other specified aftercare: Secondary | ICD-10-CM | POA: Diagnosis not present

## 2013-10-28 DIAGNOSIS — I82409 Acute embolism and thrombosis of unspecified deep veins of unspecified lower extremity: Secondary | ICD-10-CM | POA: Diagnosis not present

## 2013-11-01 DIAGNOSIS — M6281 Muscle weakness (generalized): Secondary | ICD-10-CM | POA: Diagnosis not present

## 2013-11-01 DIAGNOSIS — I129 Hypertensive chronic kidney disease with stage 1 through stage 4 chronic kidney disease, or unspecified chronic kidney disease: Secondary | ICD-10-CM | POA: Diagnosis not present

## 2013-11-01 DIAGNOSIS — R262 Difficulty in walking, not elsewhere classified: Secondary | ICD-10-CM | POA: Diagnosis not present

## 2013-11-01 DIAGNOSIS — N189 Chronic kidney disease, unspecified: Secondary | ICD-10-CM | POA: Diagnosis not present

## 2013-11-01 DIAGNOSIS — Z5189 Encounter for other specified aftercare: Secondary | ICD-10-CM | POA: Diagnosis not present

## 2013-11-22 ENCOUNTER — Ambulatory Visit (INDEPENDENT_AMBULATORY_CARE_PROVIDER_SITE_OTHER): Payer: Medicare Other | Admitting: Podiatrist

## 2013-11-22 ENCOUNTER — Encounter: Payer: Self-pay | Admitting: Podiatrist

## 2013-11-22 VITALS — BP 87/62 | HR 67 | Resp 18

## 2013-11-22 DIAGNOSIS — Z23 Encounter for immunization: Secondary | ICD-10-CM | POA: Diagnosis not present

## 2013-11-22 DIAGNOSIS — B351 Tinea unguium: Secondary | ICD-10-CM

## 2013-11-22 DIAGNOSIS — M79609 Pain in unspecified limb: Secondary | ICD-10-CM

## 2013-11-22 DIAGNOSIS — M79676 Pain in unspecified toe(s): Principal | ICD-10-CM

## 2013-11-22 NOTE — Progress Notes (Signed)
   Subjective:    Patient ID: Monica Evans, female    DOB: 04/10/1934, 79 y.o.   MRN: [id number]  HPI I AM HERE TO GET MY TOENAILS TRIMMED AND I AM A BORDERLINE DIABETIC AND I DID HAVE SURGERY ON MY BACK AND MY FINGERS AND FEET TINGLE DUE TO SOME MEDICINE I AM TAKEN AND THERE IS STILL SOME SWELLING WITH MY FEET    Review of Systems  Constitutional: Positive for activity change.       HAD BACK SURGERY ON 08/29/13  Musculoskeletal: Positive for back pain.  Hematological: Bruises/bleeds easily.  All other systems reviewed and are negative.      Objective:   Physical Exam Patient is awake, alert, and oriented x 3.  In no acute distress.  Vascular status is intact with palpable pedal pulses at 2/4 DP and PT bilateral and capillary refill time within normal limits. Neurological sensation is also intact bilaterally via Semmes Weinstein monofilament at 5/5 sites. Light touch, vibratory sensation, Achilles tendon reflex is intact. Dermatological exam reveals skin color, turger and texture as normal. No open lesions present.  Musculature intact with dorsiflexion, plantarflexion, inversion, eversion.  Painful ingrown toenails are noted bilateral first. They are not infected but are subjectively painful.  Remainder of nails are elongated and uncomfortable as well.  Mycotic in nature.      Assessment & Plan:  Symptomatic onychomycosis, ingrown bilateral hallux nail  Plan: Thorough debridement was carried out today bilateral mild slant back procedure was performed on the left great toenail. If this is not beneficial we'll consider a more permanent procedure. Will see her back in 3 months or as needed for followup.

## 2013-12-13 DIAGNOSIS — M4316 Spondylolisthesis, lumbar region: Secondary | ICD-10-CM | POA: Diagnosis not present

## 2013-12-13 DIAGNOSIS — M4806 Spinal stenosis, lumbar region: Secondary | ICD-10-CM | POA: Diagnosis not present

## 2013-12-13 DIAGNOSIS — Z09 Encounter for follow-up examination after completed treatment for conditions other than malignant neoplasm: Secondary | ICD-10-CM | POA: Diagnosis not present

## 2013-12-13 DIAGNOSIS — I10 Essential (primary) hypertension: Secondary | ICD-10-CM | POA: Diagnosis not present

## 2013-12-13 DIAGNOSIS — Z6832 Body mass index (BMI) 32.0-32.9, adult: Secondary | ICD-10-CM | POA: Diagnosis not present

## 2013-12-26 DIAGNOSIS — H4011X1 Primary open-angle glaucoma, mild stage: Secondary | ICD-10-CM | POA: Diagnosis not present

## 2014-01-26 DIAGNOSIS — Z79899 Other long term (current) drug therapy: Secondary | ICD-10-CM | POA: Diagnosis not present

## 2014-01-26 DIAGNOSIS — E119 Type 2 diabetes mellitus without complications: Secondary | ICD-10-CM | POA: Diagnosis not present

## 2014-01-26 DIAGNOSIS — I82402 Acute embolism and thrombosis of unspecified deep veins of left lower extremity: Secondary | ICD-10-CM | POA: Diagnosis not present

## 2014-01-26 DIAGNOSIS — Z0000 Encounter for general adult medical examination without abnormal findings: Secondary | ICD-10-CM | POA: Diagnosis not present

## 2014-01-26 DIAGNOSIS — I10 Essential (primary) hypertension: Secondary | ICD-10-CM | POA: Diagnosis not present

## 2014-05-15 ENCOUNTER — Ambulatory Visit (INDEPENDENT_AMBULATORY_CARE_PROVIDER_SITE_OTHER): Payer: Medicare Other

## 2014-05-15 VITALS — BP 153/90 | HR 74 | Resp 18

## 2014-05-15 DIAGNOSIS — B351 Tinea unguium: Secondary | ICD-10-CM | POA: Diagnosis not present

## 2014-05-15 DIAGNOSIS — S90424A Blister (nonthermal), right lesser toe(s), initial encounter: Secondary | ICD-10-CM

## 2014-05-15 DIAGNOSIS — L89891 Pressure ulcer of other site, stage 1: Secondary | ICD-10-CM

## 2014-05-15 DIAGNOSIS — L02611 Cutaneous abscess of right foot: Secondary | ICD-10-CM

## 2014-05-15 DIAGNOSIS — L97511 Non-pressure chronic ulcer of other part of right foot limited to breakdown of skin: Secondary | ICD-10-CM

## 2014-05-15 DIAGNOSIS — M79676 Pain in unspecified toe(s): Secondary | ICD-10-CM | POA: Diagnosis not present

## 2014-05-15 DIAGNOSIS — L03031 Cellulitis of right toe: Secondary | ICD-10-CM

## 2014-05-15 MED ORDER — CLINDAMYCIN HCL 150 MG PO CAPS: 150.0000 mg | ORAL_CAPSULE | Freq: Three times a day (TID) | ORAL | Status: DC

## 2014-05-15 MED ORDER — SILVER SULFADIAZINE 1 % EX CREA: 1.0000 "application " | TOPICAL_CREAM | Freq: Every day | CUTANEOUS | Status: AC

## 2014-05-15 NOTE — Progress Notes (Signed)
   Subjective:    Patient ID: Monica Evans, female    DOB: 1934-06-23, 80 y.o.   MRN: [id number]  HPI I HAVE AN INGROWN TOENAIL ON MY RIGHT BIG TOE AND HAS BEEN THERE FOR ABOUT 6 MONTHS AND BURNS AND THROBS AND I HAVE USED ORAL GEL TO NUMB IT AND THERE IS NO DRAINING BUT DOES GET RED AND IS SORE AND TENDER AND IS TOUCHY    Review of Systems no new findings or systemic changes noted     Objective:   Physical Exam Six-month since patient was last seen 80 year old white female well-developed well-nourished for the most part oriented presents with a complaint of a painful right great toe which she felt was in ingrowing nail have history of neuropathy and difficulties with pain in her feet last seen nearly 6 months ago by Dr. Larry Sierras 10. At this time objective findings as follows vascular status is intact DP +2 PT plus one over 4 bilateral capillary refill time 3 seconds all digits epicritic and proprioceptive sensations are intact although patient does have hyperesthesia and paresthesia consistent with neuropathy does have history of lumbar back surgery which may also be contributing to a radiculopathy. Patient does have decreased sensation to the forefoot and toes on Semmes Weinstein and hyperesthesia particular to the hallux toes bilateral. Very sensitive to touch or debridement of nails all nails painful tender symptomatically 1 through 5 bilateral hallux nails bilateral most painful symptomatically. Orthopedic biomechanical exam rectus foot type ankle metatarsal subtalar joint motions normal there is no signs of fracture or other osseous abnormality hammertoe deformities and digital contractures identified clinically radiate patient has flexible digital contractures noted begin dermatologically there is thickening dystrophy friability of nails 1 through 5 bilateral hallux left is so severe proptosis discoloration times on palpation right hallux also shows prescription dose incurvation however the main  areas of pain are at the dorsal proximal IP joint of the hallux as well as the distal medial tuft of the right hallux. Patient is wearing shoes at home that may have irritated these areas documented sketchers or Naturalizer shoes they may be too short on her foot in fact these areas show a ruptured blistering over the IP joint of the hallux right and distal tuft of the hallux right with some hemorrhage a keratoses and maceration and eschar tissue present. No purulence there is localized edema and erythema mild cellulitis of the toe noted.       Assessment & Plan:  Assessment this time patient does have history of onychomycosis painful mycotic nails 1 through 5 bilateral debrided at this time it isn't in the presence of pain symptomology as well as peripheral neuropathy and complications. Vascular status is adequate however does have abscess and localized cellulitis and secondary blistering of the hallux at the IP joint and distal tuft of the right hallux. The sites this time are cleansed with all cleanse Silvadene gauze dressing are applied following debridement over these areas dispensed some tube foam padding to cushion offload pressure on the hallux with recommend a accommodative shoe at home no barefoot no flimsy shoes flip-flops nothing tight or constrictive. Recheck in 2 weeks for follow-up placed on an antibiotic regimen clindamycin 150 mg 3 times a day 10 days also prescription for Septra or Silvadene is dispensed. Be doing sodium gauze dressing changes daily after cleansing with soap and water as instructed reevaluate within next 2 weeks  Harriet Masson DPM

## 2014-05-15 NOTE — Patient Instructions (Signed)
ANTIBACTERIAL SOAP INSTRUCTIONS  THE DAY AFTER PROCEDURE  Please follow the instructions your doctor has marked.   Shower as usual. Before getting out, place a drop of antibacterial liquid soap (Dial) on a wet, clean washcloth.  Gently wipe washcloth over affected area.  Afterward, rinse the area with warm water.  Blot the area dry with a soft cloth and cover with antibiotic ointment (neosporin, polysporin, bacitracin) and band aid or gauze and tape  Place 3-4 drops of antibacterial liquid soap in a quart of warm tap water.  Submerge foot into water for 20 minutes.  If bandage was applied after your procedure, leave on to allow for easy lift off, then remove and continue with soak for the remaining time.  Next, blot area dry with a soft cloth and cover with a bandage.   Wash toe daily with soap and water as instructed apply Silvadene and Band-Aid or gauze dressing daily as instructed until wound is healed

## 2014-05-29 ENCOUNTER — Ambulatory Visit (INDEPENDENT_AMBULATORY_CARE_PROVIDER_SITE_OTHER): Payer: Medicare Other

## 2014-05-29 VITALS — BP 154/86 | HR 72 | Resp 18

## 2014-05-29 DIAGNOSIS — M79674 Pain in right toe(s): Secondary | ICD-10-CM

## 2014-05-29 DIAGNOSIS — L89891 Pressure ulcer of other site, stage 1: Secondary | ICD-10-CM

## 2014-05-29 DIAGNOSIS — S90424A Blister (nonthermal), right lesser toe(s), initial encounter: Secondary | ICD-10-CM

## 2014-05-29 DIAGNOSIS — L97511 Non-pressure chronic ulcer of other part of right foot limited to breakdown of skin: Secondary | ICD-10-CM

## 2014-05-29 NOTE — Patient Instructions (Signed)
ANTIBACTERIAL SOAP INSTRUCTIONS  THE DAY AFTER PROCEDURE  Please follow the instructions your doctor has marked.   Shower as usual. Before getting out, place a drop of antibacterial liquid soap (Dial) on a wet, clean washcloth.  Gently wipe washcloth over affected area.  Afterward, rinse the area with warm water.  Blot the area dry with a soft cloth and cover with antibiotic ointment (neosporin, polysporin, bacitracin) and band aid or gauze and tape  Place 3-4 drops of antibacterial liquid soap in a quart of warm tap water.  Submerge foot into water for 20 minutes.  If bandage was applied after your procedure, leave on to allow for easy lift off, then remove and continue with soak for the remaining time.  Next, blot area dry with a soft cloth and cover with a bandage.  Apply other medications as directed by your doctor, such as cortisporin otic solution (eardrops) or neosporin antibiotic ointment  After cleansing with Silvadene and Band-Aid dressing daily maintaining cushioned pads to prevent toe from rubbing end of shoe

## 2014-05-29 NOTE — Progress Notes (Signed)
   Subjective:    Patient ID: Monica Evans, female    DOB: 1934-10-23, 80 y.o.   MRN: [id number]  HPI my right big toe is doing a lot better and i did pull some skin off of it and it is a little sore    Review of Systems no new findings or systemic changes noted     Objective:   Physical Exam Ulcer distal tuft right great toe appears to be resolved there patient been applying some Merthiolate however and Silvadene is well patient ulcer distal tuft still about 3 mm diameter down to dermal level only does not good down to subcutaneous tissue no fat exposure no purulence no discharge or drainage at this time healing ulcer distal tuft of right hallux this time is debrided Silvadene and Band-Aid dressing applied may discontinue by this weekend as long as there is no drainage or discharge however do maintain to foam cushion additional 2 foam cushions applied for the hallux at this time maintain accommodative shoes at all time       Assessment & Plan:  Are assessment slow healing a resolving ulceration distal tuft right great toe patient also has onychomycosis may present in the future and as-needed basis for future palliative nail care is needed the ulcer appears to be resolving well down to the dermal level only about 2-3 mm in diameter dressed with Silvadene and gauze dressing today discharge to an as-needed basis unless is fails to heal completely with the next week or 2  Harriet Masson DPM

## 2014-07-01 ENCOUNTER — Emergency Department (HOSPITAL_COMMUNITY): Payer: Medicare Other

## 2014-07-01 ENCOUNTER — Encounter (HOSPITAL_COMMUNITY): Payer: Self-pay | Admitting: *Deleted

## 2014-07-01 ENCOUNTER — Inpatient Hospital Stay (HOSPITAL_COMMUNITY)
Admission: EM | Admit: 2014-07-01 | Discharge: 2014-07-05 | DRG: 470 | Disposition: A | Discharge status: Skilled Nursing Facility | Payer: Medicare Other | Attending: Internal Medicine | Admitting: Internal Medicine

## 2014-07-01 DIAGNOSIS — R51 Headache: Secondary | ICD-10-CM | POA: Diagnosis not present

## 2014-07-01 DIAGNOSIS — H409 Unspecified glaucoma: Secondary | ICD-10-CM | POA: Diagnosis present

## 2014-07-01 DIAGNOSIS — Z7902 Long term (current) use of antithrombotics/antiplatelets: Secondary | ICD-10-CM

## 2014-07-01 DIAGNOSIS — D62 Acute posthemorrhagic anemia: Secondary | ICD-10-CM | POA: Diagnosis not present

## 2014-07-01 DIAGNOSIS — Z7952 Long term (current) use of systemic steroids: Secondary | ICD-10-CM | POA: Diagnosis not present

## 2014-07-01 DIAGNOSIS — Z86718 Personal history of other venous thrombosis and embolism: Secondary | ICD-10-CM | POA: Diagnosis not present

## 2014-07-01 DIAGNOSIS — M25552 Pain in left hip: Secondary | ICD-10-CM | POA: Diagnosis not present

## 2014-07-01 DIAGNOSIS — Z96642 Presence of left artificial hip joint: Secondary | ICD-10-CM | POA: Diagnosis not present

## 2014-07-01 DIAGNOSIS — S199XXA Unspecified injury of neck, initial encounter: Secondary | ICD-10-CM | POA: Diagnosis not present

## 2014-07-01 DIAGNOSIS — S0990XA Unspecified injury of head, initial encounter: Secondary | ICD-10-CM | POA: Diagnosis not present

## 2014-07-01 DIAGNOSIS — R7302 Impaired glucose tolerance (oral): Secondary | ICD-10-CM

## 2014-07-01 DIAGNOSIS — M25512 Pain in left shoulder: Secondary | ICD-10-CM | POA: Diagnosis present

## 2014-07-01 DIAGNOSIS — I10 Essential (primary) hypertension: Secondary | ICD-10-CM

## 2014-07-01 DIAGNOSIS — Y92018 Other place in single-family (private) house as the place of occurrence of the external cause: Secondary | ICD-10-CM | POA: Diagnosis not present

## 2014-07-01 DIAGNOSIS — Z79899 Other long term (current) drug therapy: Secondary | ICD-10-CM

## 2014-07-01 DIAGNOSIS — G629 Polyneuropathy, unspecified: Secondary | ICD-10-CM

## 2014-07-01 DIAGNOSIS — M542 Cervicalgia: Secondary | ICD-10-CM | POA: Diagnosis present

## 2014-07-01 DIAGNOSIS — W010XXD Fall on same level from slipping, tripping and stumbling without subsequent striking against object, subsequent encounter: Secondary | ICD-10-CM | POA: Diagnosis not present

## 2014-07-01 DIAGNOSIS — T380X5A Adverse effect of glucocorticoids and synthetic analogues, initial encounter: Secondary | ICD-10-CM | POA: Diagnosis present

## 2014-07-01 DIAGNOSIS — R0602 Shortness of breath: Secondary | ICD-10-CM

## 2014-07-01 DIAGNOSIS — S299XXA Unspecified injury of thorax, initial encounter: Secondary | ICD-10-CM | POA: Diagnosis not present

## 2014-07-01 DIAGNOSIS — D72828 Other elevated white blood cell count: Secondary | ICD-10-CM | POA: Diagnosis present

## 2014-07-01 DIAGNOSIS — S72009A Fracture of unspecified part of neck of unspecified femur, initial encounter for closed fracture: Secondary | ICD-10-CM

## 2014-07-01 DIAGNOSIS — S72002A Fracture of unspecified part of neck of left femur, initial encounter for closed fracture: Principal | ICD-10-CM | POA: Diagnosis present

## 2014-07-01 DIAGNOSIS — Z471 Aftercare following joint replacement surgery: Secondary | ICD-10-CM | POA: Diagnosis not present

## 2014-07-01 DIAGNOSIS — W19XXXA Unspecified fall, initial encounter: Secondary | ICD-10-CM

## 2014-07-01 DIAGNOSIS — E119 Type 2 diabetes mellitus without complications: Secondary | ICD-10-CM | POA: Diagnosis present

## 2014-07-01 DIAGNOSIS — W010XXA Fall on same level from slipping, tripping and stumbling without subsequent striking against object, initial encounter: Secondary | ICD-10-CM | POA: Diagnosis present

## 2014-07-01 DIAGNOSIS — M21252 Flexion deformity, left hip: Secondary | ICD-10-CM | POA: Diagnosis not present

## 2014-07-01 DIAGNOSIS — S72002D Fracture of unspecified part of neck of left femur, subsequent encounter for closed fracture with routine healing: Secondary | ICD-10-CM | POA: Diagnosis not present

## 2014-07-01 DIAGNOSIS — T148 Other injury of unspecified body region: Secondary | ICD-10-CM | POA: Diagnosis not present

## 2014-07-01 DIAGNOSIS — K219 Gastro-esophageal reflux disease without esophagitis: Secondary | ICD-10-CM | POA: Diagnosis present

## 2014-07-01 LAB — CBC WITH DIFFERENTIAL/PLATELET
BASOS ABS: 0.1 10*3/uL (ref 0.0–0.1)
BASOS PCT: 1 % (ref 0–1)
EOS ABS: 0.3 10*3/uL (ref 0.0–0.7)
Eosinophils Relative: 2 % (ref 0–5)
HEMATOCRIT: 39.1 % (ref 36.0–46.0)
Hemoglobin: 12 g/dL (ref 12.0–15.0)
Lymphocytes Relative: 6 % — ABNORMAL LOW (ref 12–46)
Lymphs Abs: 0.8 10*3/uL (ref 0.7–4.0)
MCH: 30.8 pg (ref 26.0–34.0)
MCHC: 30.7 g/dL (ref 30.0–36.0)
MCV: 100.5 fL — ABNORMAL HIGH (ref 78.0–100.0)
Monocytes Absolute: 0.9 10*3/uL (ref 0.1–1.0)
Monocytes Relative: 7 % (ref 3–12)
Neutro Abs: 11.6 10*3/uL — ABNORMAL HIGH (ref 1.7–7.7)
Neutrophils Relative %: 84 % — ABNORMAL HIGH (ref 43–77)
PLATELETS: 302 10*3/uL (ref 150–400)
RBC: 3.89 MIL/uL (ref 3.87–5.11)
RDW: 16.1 % — ABNORMAL HIGH (ref 11.5–15.5)
WBC: 13.6 10*3/uL — AB (ref 4.0–10.5)

## 2014-07-01 LAB — BASIC METABOLIC PANEL
Anion gap: 8 (ref 5–15)
BUN: 17 mg/dL (ref 6–23)
CHLORIDE: 100 mmol/L (ref 96–112)
CO2: 30 mmol/L (ref 19–32)
Calcium: 8.9 mg/dL (ref 8.4–10.5)
Creatinine, Ser: 1.07 mg/dL (ref 0.50–1.10)
GFR calc non Af Amer: 48 mL/min — ABNORMAL LOW (ref >90)
GFR, EST AFRICAN AMERICAN: 56 mL/min — AB (ref >90)
Glucose, Bld: 158 mg/dL — ABNORMAL HIGH (ref 70–99)
POTASSIUM: 4.1 mmol/L (ref 3.5–5.1)
SODIUM: 138 mmol/L (ref 135–145)

## 2014-07-01 LAB — URINALYSIS, ROUTINE W REFLEX MICROSCOPIC
Bilirubin Urine: NEGATIVE
Glucose, UA: NEGATIVE mg/dL
Hgb urine dipstick: NEGATIVE
Ketones, ur: NEGATIVE mg/dL
Leukocytes, UA: NEGATIVE
Nitrite: NEGATIVE
PROTEIN: NEGATIVE mg/dL
Specific Gravity, Urine: 1.01 (ref 1.005–1.030)
UROBILINOGEN UA: 0.2 mg/dL (ref 0.0–1.0)
pH: 7.5 (ref 5.0–8.0)

## 2014-07-01 LAB — TYPE AND SCREEN
ABO/RH(D): O POS
Antibody Screen: NEGATIVE

## 2014-07-01 LAB — SURGICAL PCR SCREEN
MRSA, PCR: NEGATIVE
STAPHYLOCOCCUS AUREUS: POSITIVE — AB

## 2014-07-01 LAB — GLUCOSE, CAPILLARY: GLUCOSE-CAPILLARY: 200 mg/dL — AB (ref 70–99)

## 2014-07-01 LAB — PROTIME-INR
INR: 1.05 (ref 0.00–1.49)
PROTHROMBIN TIME: 13.8 s (ref 11.6–15.2)

## 2014-07-01 MED ORDER — ENOXAPARIN SODIUM 40 MG/0.4ML ~~LOC~~ SOLN
40.0000 mg | SUBCUTANEOUS | Status: DC
  Administered 2014-07-02 – 2014-07-03 (×2): 40 mg via SUBCUTANEOUS
  Filled 2014-07-01 (×2): qty 0.4

## 2014-07-01 MED ORDER — ONDANSETRON HCL 4 MG/2ML IJ SOLN
4.0000 mg | Freq: Once | INTRAMUSCULAR | Status: AC
  Administered 2014-07-01: 4 mg via INTRAVENOUS
  Filled 2014-07-01: qty 2

## 2014-07-01 MED ORDER — TRAMADOL HCL 50 MG PO TABS
50.0000 mg | ORAL_TABLET | Freq: Four times a day (QID) | ORAL | Status: DC | PRN
  Administered 2014-07-02 – 2014-07-05 (×9): 50 mg via ORAL
  Filled 2014-07-01 (×9): qty 1

## 2014-07-01 MED ORDER — HYDROCODONE-ACETAMINOPHEN 5-325 MG PO TABS
1.0000 | ORAL_TABLET | Freq: Four times a day (QID) | ORAL | Status: DC | PRN
  Administered 2014-07-01 – 2014-07-05 (×9): 2 via ORAL
  Filled 2014-07-01 (×9): qty 2

## 2014-07-01 MED ORDER — CEFAZOLIN SODIUM-DEXTROSE 2-3 GM-% IV SOLR
2.0000 g | INTRAVENOUS | Status: AC
  Administered 2014-07-02: 2 g via INTRAVENOUS
  Filled 2014-07-01: qty 50

## 2014-07-01 MED ORDER — INSULIN ASPART 100 UNIT/ML ~~LOC~~ SOLN
0.0000 [IU] | Freq: Three times a day (TID) | SUBCUTANEOUS | Status: DC
  Administered 2014-07-02: 2 [IU] via SUBCUTANEOUS
  Administered 2014-07-04: 1 [IU] via SUBCUTANEOUS

## 2014-07-01 MED ORDER — GABAPENTIN 400 MG PO CAPS
800.0000 mg | ORAL_CAPSULE | Freq: Three times a day (TID) | ORAL | Status: DC
  Administered 2014-07-01 – 2014-07-05 (×11): 800 mg via ORAL
  Filled 2014-07-01 (×12): qty 2

## 2014-07-01 MED ORDER — CALCIUM CARBONATE-VITAMIN D 500-200 MG-UNIT PO TABS
1.0000 | ORAL_TABLET | Freq: Two times a day (BID) | ORAL | Status: DC
  Administered 2014-07-01 – 2014-07-05 (×8): 1 via ORAL
  Filled 2014-07-01 (×16): qty 1

## 2014-07-01 MED ORDER — POTASSIUM CHLORIDE ER 10 MEQ PO TBCR
30.0000 meq | EXTENDED_RELEASE_TABLET | Freq: Two times a day (BID) | ORAL | Status: DC
  Administered 2014-07-01 – 2014-07-05 (×7): 30 meq via ORAL
  Filled 2014-07-01 (×9): qty 3

## 2014-07-01 MED ORDER — SODIUM CHLORIDE 0.9 % IV SOLN
1000.0000 mL | INTRAVENOUS | Status: DC
  Administered 2014-07-01: 1000 mL via INTRAVENOUS

## 2014-07-01 MED ORDER — PANTOPRAZOLE SODIUM 40 MG PO TBEC
40.0000 mg | DELAYED_RELEASE_TABLET | Freq: Every day | ORAL | Status: DC
  Administered 2014-07-02 – 2014-07-05 (×4): 40 mg via ORAL
  Filled 2014-07-01 (×4): qty 1

## 2014-07-01 MED ORDER — METHYLPREDNISOLONE SODIUM SUCC 125 MG IJ SOLR
40.0000 mg | Freq: Once | INTRAMUSCULAR | Status: AC
  Administered 2014-07-01: 40 mg via INTRAVENOUS
  Filled 2014-07-01: qty 2

## 2014-07-01 MED ORDER — ACETAMINOPHEN 500 MG PO TABS
1000.0000 mg | ORAL_TABLET | Freq: Once | ORAL | Status: DC
Start: 2014-07-01 — End: 2014-07-02
  Filled 2014-07-01: qty 2

## 2014-07-01 MED ORDER — FUROSEMIDE 40 MG PO TABS
40.0000 mg | ORAL_TABLET | Freq: Every day | ORAL | Status: DC
  Administered 2014-07-02 – 2014-07-05 (×4): 40 mg via ORAL
  Filled 2014-07-01 (×4): qty 1

## 2014-07-01 MED ORDER — MORPHINE SULFATE 4 MG/ML IJ SOLN
4.0000 mg | Freq: Once | INTRAMUSCULAR | Status: AC
  Administered 2014-07-01: 4 mg via INTRAVENOUS
  Filled 2014-07-01: qty 1

## 2014-07-01 MED ORDER — TIMOLOL MALEATE 0.5 % OP SOLN
1.0000 [drp] | Freq: Two times a day (BID) | OPHTHALMIC | Status: DC
  Administered 2014-07-01 – 2014-07-05 (×7): 1 [drp] via OPHTHALMIC
  Filled 2014-07-01: qty 5

## 2014-07-01 MED ORDER — VERAPAMIL HCL ER 240 MG PO TBCR
240.0000 mg | EXTENDED_RELEASE_TABLET | Freq: Every day | ORAL | Status: DC
Start: 2014-07-01 — End: 2014-07-05
  Administered 2014-07-01 – 2014-07-04 (×3): 240 mg via ORAL
  Filled 2014-07-01 (×5): qty 1

## 2014-07-01 MED ORDER — DEXTROSE-NACL 5-0.45 % IV SOLN
100.0000 mL/h | INTRAVENOUS | Status: DC
  Administered 2014-07-01: 100 mL/h via INTRAVENOUS

## 2014-07-01 MED ORDER — MORPHINE SULFATE 2 MG/ML IJ SOLN
0.5000 mg | INTRAMUSCULAR | Status: DC | PRN
  Administered 2014-07-01 – 2014-07-02 (×2): 0.5 mg via INTRAVENOUS
  Filled 2014-07-01 (×2): qty 1

## 2014-07-01 MED ORDER — ASPIRIN 81 MG PO CHEW
81.0000 mg | CHEWABLE_TABLET | Freq: Every day | ORAL | Status: DC
  Administered 2014-07-01: 81 mg via ORAL
  Filled 2014-07-01: qty 1

## 2014-07-01 MED ORDER — TIMOLOL HEMIHYDRATE 0.5 % OP SOLN: 1.0000 [drp] | Freq: Two times a day (BID) | OPHTHALMIC | Status: DC

## 2014-07-01 MED ORDER — MORPHINE SULFATE 4 MG/ML IJ SOLN
4.0000 mg | INTRAMUSCULAR | Status: DC | PRN
  Administered 2014-07-01: 4 mg via INTRAVENOUS
  Filled 2014-07-01: qty 1

## 2014-07-01 MED ORDER — SENNOSIDES-DOCUSATE SODIUM 8.6-50 MG PO TABS: 1.0000 | ORAL_TABLET | Freq: Every evening | ORAL | Status: DC | PRN

## 2014-07-01 NOTE — Consult Note (Signed)
ORTHOPAEDIC CONSULTATION  REQUESTING PHYSICIAN: [place], DO  Chief Complaint: left hip fracture  HPI: Monica Evans is a 80 y.o. female who complains of A mechanical fall and she was walking in from her garage. She complains of pain at the left hip. She is a history of neuropathy but she is not sure why. She said several lumbar procedures but healed well from these per report.  Past Medical History  Diagnosis Date  . Diabetes mellitus without complication     prediabetes- HgbA1C- has been monitored  by PCP- Dr. Nicki Reaper- [facility]   . Complication of anesthesia   . PONV (postoperative nausea and vomiting)     nausea  . Hypertension   . Pneumonia     hosp. many yrs. ago for pneumonia  . GERD (gastroesophageal reflux disease)   . Arthritis     + lumbar stenosis  . 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  Marland Kitchen Apical lung scarring     lung scarring related to disease that was diagnosed when she was 80 y.o.   . 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    . Glaucoma     left eye only  . DVT (deep venous thrombosis) 10/03/2013    LEFT LEG   Past Surgical History  Procedure Laterality Date  . Eye surgery Bilateral     IOL only in R eye  . Tubal ligation    . Posterior fusion lumbar spine  08/29/2013    LEVEL 2    History   Social History  . Marital Status: Widowed    Spouse Name: N/A  . Number of Children: N/A  . Years of Education: N/A   Social History Main Topics  . Smoking status: Never Smoker   . Smokeless tobacco: Never Used  . Alcohol Use: No  . Drug Use: No  . Sexual Activity: Not on file   Other Topics Concern  . None   Social History Narrative   History reviewed. No pertinent family history. Allergies  Allergen Reactions  . Penicillins Swelling    Tolerated Ancef 08/29/13, tdd   Prior to Admission medications   Medication Sig Start  Date End Date Taking? Authorizing Provider  ACCU-CHEK FASTCLIX LANCETS MISC See admin instructions. 02/08/14   Historical Provider, MD  ACCU-CHEK SMARTVIEW test strip  04/27/14   Historical Provider, MD  aspirin 81 MG tablet Take 81 mg by mouth at bedtime.    Historical Provider, MD  Calcium Carbonate-Vitamin D (CALCIUM-VITAMIN D) 500-200 MG-UNIT per tablet Take 1 tablet by mouth 2 (two) times daily.    Historical Provider, MD  cephALEXin (KEFLEX) 500 MG capsule  10/01/13   Historical Provider, MD  clindamycin (CLEOCIN) 150 MG capsule Take 1 capsule (150 mg total) by mouth 3 (three) times daily. 05/15/14   Harriet Masson, DPM  co-enzyme Q-10 30 MG capsule Take 30 mg by mouth daily.    Historical Provider, MD  diazepam (VALIUM) 5 MG tablet  09/27/13   Historical Provider, MD  enoxaparin (LOVENOX) 80 MG/0.8ML injection Inject 75 mg into the skin every 12 (twelve) hours.    Historical Provider, MD  furosemide (LASIX) 40 MG tablet Take 80 mg by mouth every evening.    Historical Provider, MD  gabapentin (NEURONTIN) 600 MG tablet Take 600 mg by mouth 2 (two) times daily.  03/08/14   Historical Provider, MD  nystatin cream (MYCOSTATIN)  05/03/14   Historical Provider, MD  nystatin-triamcinolone ointment Lilyan Gilford)  10/01/13   Historical Provider, MD  omeprazole (PRILOSEC) 20 MG capsule  11/20/13   Historical Provider, MD  oxyCODONE-acetaminophen (PERCOCET/ROXICET) 5-325 MG per tablet  09/27/13   Historical Provider, MD  potassium chloride (MICRO-K) 10 MEQ CR capsule Take 30 mEq by mouth 2 (two) times daily.    Historical Provider, MD  predniSONE (DELTASONE) 10 MG tablet Take 10 mg by mouth daily with breakfast.    Historical Provider, MD  silver sulfADIAZINE (SILVADENE) 1 % cream Apply 1 application topically daily. 05/15/14   Harriet Masson, DPM  timolol (BETIMOL) 0.5 % ophthalmic solution Place 1 drop into the left eye 2 (two) times daily.    Historical Provider, MD  timolol (TIMOPTIC) 0.5 % ophthalmic solution   03/30/14   Historical Provider, MD  traMADol Veatrice Bourbon) 50 MG tablet  10/31/13   Historical Provider, MD  verapamil (CALAN-SR) 240 MG CR tablet Take 240 mg by mouth at bedtime.    Historical Provider, MD  XARELTO 20 MG TABS tablet  10/28/13   Historical Provider, MD   No results found.  Positive ROS: All other systems have been reviewed and were otherwise negative with the exception of those mentioned in the HPI and as above.  Labs cbc  Recent Labs  07/01/14 1550  WBC 13.6*  HGB 12.0  HCT 39.1  PLT 302    Labs inflam No results for input(s): CRP in the last 72 hours.  Invalid input(s): ESR  Labs coag  Recent Labs  07/01/14 1550  INR 1.05     Recent Labs  07/01/14 1550  NA 138  K 4.1  CL 100  CO2 30  GLUCOSE 158*  BUN 17  CREATININE 1.07  CALCIUM 8.9    Physical Exam: Filed Vitals:   07/01/14 1530  BP: 158/63  Pulse: 86  Temp:   Resp: 22   General: Alert, no acute distress Cardiovascular: No pedal edema Respiratory: No cyanosis, no use of accessory musculature GI: No organomegaly, abdomen is soft and non-tender Skin: No lesions in the area of chief complaint other than those listed below in MSK exam.  Neurologic: Sensation intact distally Psychiatric: Patient is competent for consent with normal mood and affect Lymphatic: No axillary or cervical lymphadenopathy  MUSCULOSKELETAL:  LLE: pain with log roll. Compartments are soft. Distally sensation is decreased globally to both feet Other extremities are atraumatic with painless ROM and NVI.  Assessment: Left fem neck fracture  Plan: Left hip hemi at 7:30 tomorrow am(sunday) Weight Bearing Status: bedrest for now, will adjust post op PT VTE px: SCD's and please hold chemical for am, I will restart post op.    Renette Butters, MD Cell [phone]   07/01/2014 4:46 PM

## 2014-07-01 NOTE — ED Notes (Signed)
Pt arrives from home via [facility]. Pt states she was tripped by her dog and fell onto her left side and into a coffee table. Pt has a contusion to her left side of neck, bilateral knee abrasions and her left leg appears shortened and externally rotated. Pt received 50 mcg of fentanyl pta.

## 2014-07-01 NOTE — ED Notes (Signed)
Pt denies hitting her head or loc. 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

## 2014-07-01 NOTE — ED Provider Notes (Addendum)
TIME SEEN: 3:15 PM  CHIEF COMPLAINT:  Fall, left hip pain  HPI: Pt is a 80 y.o.  Female with history of hypertension , diabetes, prior history of DVT who is no longer on anticoagulation who tripped and fell over her dog at her house today landing on her left side. She denies hitting her head but has bruising to the left side of her neck. No loss of consciousness. No preceding symptoms of chest pain, shortness of breath or dizziness that led to her fall. Recently ate crackers 1 -2 hours ago.   Was unable to get up off the floor after her fall. Is not having neck or back pain but is complaining of left hip pain. Normally ambulates without assistance but at times will use a cane. Lives at home currently with her son.   She does not have an orthopedic physician.  PCP is Dr. Ann Held in [place]  ROS: See HPI Constitutional: no fever  Eyes: no drainage  ENT: no runny nose   Cardiovascular:  no chest pain  Resp: no SOB  GI: no vomiting GU: no dysuria Integumentary: no rash  Allergy: no hives  Musculoskeletal: no leg swelling  Neurological: no slurred speech ROS otherwise negative  PAST MEDICAL HISTORY/PAST SURGICAL HISTORY:  Past Medical History  Diagnosis Date  . Diabetes mellitus without complication     prediabetes- HgbA1C- has been monitored  by PCP- Dr. Nicki Reaper- [facility]   . Complication of anesthesia   . PONV (postoperative nausea and vomiting)     nausea  . Hypertension   . Pneumonia     hosp. many yrs. ago for pneumonia  . GERD (gastroesophageal reflux disease)   . Arthritis     + lumbar stenosis  . Blood dyscrasia     rare blood disease - since she was 8 yrs. old that has lead to chest/lung scarring .  Marland Kitchen Apical lung scarring     lung scarring related to disease that was diagnosed when she was 80 y.o.   . 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    . Glaucoma     left eye only  . DVT (deep venous thrombosis) 10/03/2013     LEFT LEG    MEDICATIONS:  Prior to Admission medications   Medication Sig Start Date End Date Taking? Authorizing Provider  ACCU-CHEK FASTCLIX LANCETS MISC See admin instructions. 02/08/14   Historical Provider, MD  ACCU-CHEK SMARTVIEW test strip  04/27/14   Historical Provider, MD  aspirin 81 MG tablet Take 81 mg by mouth at bedtime.    Historical Provider, MD  Calcium Carbonate-Vitamin D (CALCIUM-VITAMIN D) 500-200 MG-UNIT per tablet Take 1 tablet by mouth 2 (two) times daily.    Historical Provider, MD  cephALEXin (KEFLEX) 500 MG capsule  10/01/13   Historical Provider, MD  clindamycin (CLEOCIN) 150 MG capsule Take 1 capsule (150 mg total) by mouth 3 (three) times daily. 05/15/14   Harriet Masson, DPM  co-enzyme Q-10 30 MG capsule Take 30 mg by mouth daily.    Historical Provider, MD  diazepam (VALIUM) 5 MG tablet  09/27/13   Historical Provider, MD  enoxaparin (LOVENOX) 80 MG/0.8ML injection Inject 75 mg into the skin every 12 (twelve) hours.    Historical Provider, MD  furosemide (LASIX) 40 MG tablet Take 80 mg by mouth every evening.    Historical Provider, MD  gabapentin (NEURONTIN) 600 MG tablet Take 600 mg by mouth 2 (two)  times daily. 03/08/14   Historical Provider, MD  nystatin cream (MYCOSTATIN)  05/03/14   Historical Provider, MD  nystatin-triamcinolone ointment Lilyan Gilford)  10/01/13   Historical Provider, MD  omeprazole (PRILOSEC) 20 MG capsule  11/20/13   Historical Provider, MD  oxyCODONE-acetaminophen (PERCOCET/ROXICET) 5-325 MG per tablet  09/27/13   Historical Provider, MD  potassium chloride (MICRO-K) 10 MEQ CR capsule Take 30 mEq by mouth 2 (two) times daily.    Historical Provider, MD  predniSONE (DELTASONE) 10 MG tablet Take 10 mg by mouth daily with breakfast.    Historical Provider, MD  silver sulfADIAZINE (SILVADENE) 1 % cream Apply 1 application topically daily. 05/15/14   Harriet Masson, DPM  timolol (BETIMOL) 0.5 % ophthalmic solution Place 1 drop into the left eye 2 (two)  times daily.    Historical Provider, MD  timolol (TIMOPTIC) 0.5 % ophthalmic solution  03/30/14   Historical Provider, MD  traMADol Veatrice Bourbon) 50 MG tablet  10/31/13   Historical Provider, MD  verapamil (CALAN-SR) 240 MG CR tablet Take 240 mg by mouth at bedtime.    Historical Provider, MD  XARELTO 20 MG TABS tablet  10/28/13   Historical Provider, MD    ALLERGIES:  Allergies  Allergen Reactions  . Penicillins Swelling    Tolerated Ancef 08/29/13, tdd    SOCIAL HISTORY:  History  Substance Use Topics  . Smoking status: Never Smoker   . Smokeless tobacco: Never Used  . Alcohol Use: No    FAMILY HISTORY: History reviewed. No pertinent family history.  EXAM: BP 165/62 mmHg  Pulse 82  Temp(Src) 98.3 F (36.8 C) (Oral)  Resp 18  Ht 5' (1.524 m)  Wt 177 lb (80.287 kg)  BMI 34.57 kg/m2  SpO2 96% CONSTITUTIONAL: Alert and oriented and responds appropriately to questions. Well-appearing; well-nourished; GCS 15 HEAD: Normocephalic;  Ecchymosis to the left lateral neck with minimal swelling EYES: Conjunctivae clear, PERRL, EOMI ENT: normal nose; no rhinorrhea; moist mucous membranes; pharynx without lesions noted; no dental injury;  Normal phonation, no stridor, no septal hematoma NECK: Supple, no meningismus, no LAD; no midline spinal tenderness, step-off or deformity CARD: RRR; S1 and S2 appreciated; no murmurs, no clicks, no rubs, no gallops RESP: Normal chest excursion without splinting or tachypnea; breath sounds clear and equal bilaterally; no wheezes, no rhonchi, no rales; chest wall stable, nontender to palpation , ecchymosis over the left chest wall without crepitus or deformity ABD/GI: Normal bowel sounds; non-distended; soft, non-tender, no rebound, no guarding PELVIS:  stable,  Tender over the left hip, left leg is shortened and externally rotated, 2+ DP pulses bilaterally , sensation to light touch intact diffusely BACK:  The back appears normal and is non-tender to palpation,  there is no CVA tenderness; no midline spinal tenderness, step-off or deformity EXT:  Abrasions to bilateral knees but no bony tenderness or deformity, left leg is shortened and externally rotated with decreased range of motion secondary to pain, otherwise Normal ROM in all joints;  Otherwise extremities are non-tender to palpation; no edema; normal capillary refill; no cyanosis    SKIN: Normal color for age and race; warm NEURO: Moves all extremities equally , sensation to light touch intact diffusely, cranial nerves II through XII intact PSYCH: The patient's mood and manner are appropriate. Grooming and personal hygiene are appropriate.  MEDICAL DECISION MAKING:  Patient here with mechanical fall with left hip fracture. Will obtain labs, urine. Will obtain x-rays of her hip and chest. Will obtain CT of her head  and cervical spine. Will provide pain and nausea medicine. We'll give IV fluids and keep nothing by mouth.  ED PROGRESS: 4:30 PM  Pt  Left femoral neck fracture. Discussed with Dr. Percell Miller who will take her to the operating room tomorrow.   CT head and cervical spine unremarkable. Labs show leukocytosis which is likely reactive but otherwise within normal limits.   5:09 PM  Spoke with Dr. Carles Collet  with hospitalist service  Who will admit the patient. Updated patient and son at bedside.    EKG Interpretation  Date/Time:  Saturday July 01 2014 15:47:43 EDT Ventricular Rate:  80 PR Interval:  160 QRS Duration: 84 QT Interval:  377 QTC Calculation: 435 R Axis:   28 Text Interpretation:  Sinus rhythm Probable left atrial enlargement Low voltage, precordial leads No significant change since last tracing Confirmed by Jeferson Boozer,  DO, Wyman Meschke [phone]) on 07/01/2014 3:57:44 PM        [place], DO 07/01/14 [place], DO 07/01/14 1717

## 2014-07-01 NOTE — H&P (Signed)
History and Physical  Monica Evans JTT:[id number] DOB: 1934/08/25 DOA: 07/01/2014   PCP: Ann Held, MD   Chief Complaint: left hip pain after fall  HPI:  80 year old female with a history of impaired glucose tolerance, hypertension, left lower extremity DVT, and eosinophilic pneumonia presented after a mechanical fall as she was walking through her doorway. In addition, it appears that the patient may have tripped on her dog as well. There was no syncope. The patient had excruciating left-sided pain and was unable to get up. As result EMS was activated. The patient did not have any prodromal symptoms of chest discomfort, shortness breath, dizziness.  Patient denies fevers, chills, headache, chest pain, dyspnea, nausea, vomiting, diarrhea, abdominal pain, dysuria, hematuria.  At baseline, the patient uses a cane to ambulate. The patient did have some neck pain after her mechanical fall but denies any focal extremity weakness or radicular symptoms. In the emergency department, the patient was afebrile and hemodynamically stable. CT of the cervical spine and brain were unremarkable. X-ray of the pelvis revealed a left femoral neck fracture. Chest x-ray showed a small left pleural effusion, but was otherwise negative. EKG shows sinus rhythm without ST-T wave changes.  Assessment/Plan: Left femoral neck fracture -Dr. Edmonia Lynch has seen patient and plans left hemiarthroplasty in the morning of 07/02/2014 -Patient is medically stable for surgery at this time -Recheck BMP in the morning -Give patient stress dose of steroids Hypertension -Continue verapamil -BP elevation in part due to pain -Continue home dose of furosemide 40 mg daily--she does not take 80 mg that was stated in [facility] Eosinophilic pneumonia -Continue home dose of prednisone -give stress dose of Solu-Medrol this evening Impaired Glucose tolerance -Hemoglobin A1c -novolog sliding scale Peripheral neuropathy -Continue  tramadol and gabapentin History of DVT -Patient is no longer taking rivaroxaban -Previous DVT diagnosed in June 2015 Neck pain -Improved with opioids -CT neck negative for subluxation or fracture -CT brain negative for acute findings       Past Medical History  Diagnosis Date  . Diabetes mellitus without complication     prediabetes- HgbA1C- has been monitored  by PCP- Dr. Nicki Reaper- [facility]   . Complication of anesthesia   . PONV (postoperative nausea and vomiting)     nausea  . Hypertension   . Pneumonia     hosp. many yrs. ago for pneumonia  . GERD (gastroesophageal reflux disease)   . Arthritis     + lumbar stenosis  . Blood dyscrasia     rare blood disease - since she was 27 yrs. old that has lead to chest/lung scarring .  Marland Kitchen Apical lung scarring     lung scarring related to disease that was diagnosed when she was 80 y.o.   . 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    . Glaucoma     left eye only  . DVT (deep venous thrombosis) 10/03/2013    LEFT LEG   Past Surgical History  Procedure Laterality Date  . Eye surgery Bilateral     IOL only in R eye  . Tubal ligation    . Posterior fusion lumbar spine  08/29/2013    LEVEL 2    Social History:  reports that she has never smoked. She has never used smokeless tobacco. She reports that she does not drink alcohol or use illicit drugs.   History reviewed. No pertinent family history.   Allergies  Allergen  Reactions  . Penicillins Swelling    Tolerated Ancef 08/29/13, tdd      Prior to Admission medications   Medication Sig Start Date End Date Taking? Authorizing Provider  ACCU-CHEK FASTCLIX LANCETS MISC See admin instructions. 02/08/14   Historical Provider, MD  ACCU-CHEK SMARTVIEW test strip  04/27/14   Historical Provider, MD  aspirin 81 MG tablet Take 81 mg by mouth at bedtime.    Historical Provider, MD  Calcium Carbonate-Vitamin D (CALCIUM-VITAMIN D) 500-200  MG-UNIT per tablet Take 1 tablet by mouth 2 (two) times daily.    Historical Provider, MD  cephALEXin (KEFLEX) 500 MG capsule  10/01/13   Historical Provider, MD  clindamycin (CLEOCIN) 150 MG capsule Take 1 capsule (150 mg total) by mouth 3 (three) times daily. 05/15/14   Harriet Masson, DPM  co-enzyme Q-10 30 MG capsule Take 30 mg by mouth daily.    Historical Provider, MD  diazepam (VALIUM) 5 MG tablet  09/27/13   Historical Provider, MD  enoxaparin (LOVENOX) 80 MG/0.8ML injection Inject 75 mg into the skin every 12 (twelve) hours.    Historical Provider, MD  furosemide (LASIX) 40 MG tablet Take 80 mg by mouth every evening.    Historical Provider, MD  gabapentin (NEURONTIN) 600 MG tablet Take 600 mg by mouth 2 (two) times daily. 03/08/14   Historical Provider, MD  nystatin cream (MYCOSTATIN)  05/03/14   Historical Provider, MD  nystatin-triamcinolone ointment Lilyan Gilford)  10/01/13   Historical Provider, MD  omeprazole (PRILOSEC) 20 MG capsule  11/20/13   Historical Provider, MD  oxyCODONE-acetaminophen (PERCOCET/ROXICET) 5-325 MG per tablet  09/27/13   Historical Provider, MD  potassium chloride (MICRO-K) 10 MEQ CR capsule Take 30 mEq by mouth 2 (two) times daily.    Historical Provider, MD  predniSONE (DELTASONE) 10 MG tablet Take 10 mg by mouth daily with breakfast.    Historical Provider, MD  silver sulfADIAZINE (SILVADENE) 1 % cream Apply 1 application topically daily. 05/15/14   Harriet Masson, DPM  timolol (BETIMOL) 0.5 % ophthalmic solution Place 1 drop into the left eye 2 (two) times daily.    Historical Provider, MD  timolol (TIMOPTIC) 0.5 % ophthalmic solution  03/30/14   Historical Provider, MD  traMADol Veatrice Bourbon) 50 MG tablet  10/31/13   Historical Provider, MD  verapamil (CALAN-SR) 240 MG CR tablet Take 240 mg by mouth at bedtime.    Historical Provider, MD  XARELTO 20 MG TABS tablet  10/28/13   Historical Provider, MD    Review of Systems:  Constitutional:  No weight loss, night sweats,  Fevers, chills, fatigue.  Head&Eyes: No headache.  No vision loss.  No eye pain or scotoma ENT:  No Difficulty swallowing,Tooth/dental problems,Sore throat,   Cardio-vascular:  No chest pain, Orthopnea, PND, swelling in lower extremities,  dizziness, palpitations  GI:  No  abdominal pain, nausea, vomiting, diarrhea, loss of appetite, hematochezia, melena, heartburn, indigestion, Resp:  No shortness of breath with exertion or at rest. No cough. No coughing up of blood .No wheezing. Skin:  no rash or lesions.  GU:  no dysuria, change in color of urine, no urgency or frequency. No flank pain.  Musculoskeletal:  No joint pain or swelling. No decreased range of motion.  Psych:  No change in mood or affect. No depression or anxiety. Neurologic: No headache, no dysesthesia, no focal weakness, no vision loss. No syncope  Physical Exam: Filed Vitals:   07/01/14 1502 07/01/14 1509 07/01/14 1530  BP: 165/62 165/62 158/63  Pulse:  82  86  Temp: 98.3 F (36.8 C) 98.3 F (36.8 C)   TempSrc: Oral Oral   Resp: 18  22  Height: 5' (1.524 m)    Weight: 80.287 kg (177 lb)    SpO2: 96%  97%   General:  A&O x 3, NAD, nontoxic, pleasant/cooperative Head/Eye: No conjunctival hemorrhage, no icterus, [place]/AT, No nystagmus ENT:  No icterus,  No thrush, no pharyngeal exudate Neck:  No masses, no lymphadenpathy, no bruits CV:  RRR, no rub, no gallop, no S3 Lung:  CTAB, good air movement, no wheeze, no rhonchi Abdomen: soft/NT, +BS, nondistended, no peritoneal signs Ext: No cyanosis, No rashes, No petechiae, No lymphangitis, 1+LE edema   Labs on Admission:  Basic Metabolic Panel:  Recent Labs Lab 07/01/14 1550  NA 138  K 4.1  CL 100  CO2 30  GLUCOSE 158*  BUN 17  CREATININE 1.07  CALCIUM 8.9   Liver Function Tests: No results for input(s): AST, ALT, ALKPHOS, BILITOT, PROT, ALBUMIN in the last 168 hours. No results for input(s): LIPASE, AMYLASE in the last 168 hours. No results for  input(s): AMMONIA in the last 168 hours. CBC:  Recent Labs Lab 07/01/14 1550  WBC 13.6*  NEUTROABS 11.6*  HGB 12.0  HCT 39.1  MCV 100.5*  PLT 302   Cardiac Enzymes: No results for input(s): CKTOTAL, CKMB, CKMBINDEX, TROPONINI in the last 168 hours. BNP: Invalid input(s): POCBNP CBG: No results for input(s): GLUCAP in the last 168 hours.  Radiological Exams on Admission: Dg Chest 1 View  07/01/2014   CLINICAL DATA:  Fall today  EXAM: CHEST  1 VIEW  COMPARISON:  09/30/2013  FINDINGS: Cardiac enlargement without heart failure. Atherosclerotic ectatic aorta.  Negative for pneumonia or edema. Small left effusion. No displaced rib fracture is identified. Advanced degenerative change right shoulder joint.  IMPRESSION: Small left pleural effusion.  No rib fracture is identified.   Electronically Signed   By: Franchot Gallo M.D.   On: 07/01/2014 16:48   Ct Head Wo Contrast  07/01/2014   CLINICAL DATA:  [place] and hit back of head earlier today. Head pain. Neck pain.  EXAM: CT HEAD WITHOUT CONTRAST  CT CERVICAL SPINE WITHOUT CONTRAST  TECHNIQUE: Multidetector CT imaging of the head and cervical spine was performed following the standard protocol without intravenous contrast. Multiplanar CT image reconstructions of the cervical spine were also generated.  COMPARISON:  CT head 01/15/2009.  FINDINGS: CT HEAD FINDINGS  No evidence for acute infarction, hemorrhage, mass lesion, hydrocephalus, or extra-axial fluid. Generalized atrophy not unexpected for age 58. Chronic microvascular ischemic change affects periventricular and subcortical white matter. No calvarial lesions. No skull fracture. Vascular calcification. No sinus or mastoid air fluid level.  CT CERVICAL SPINE FINDINGS  There is no visible cervical spine fracture, traumatic subluxation, prevertebral soft tissue swelling, or intraspinal hematoma. Ten swan-neck deformity related to degenerative change. Severe disc space narrowing C5-6 and C6-7.  Advanced facet arthropathy at multiple levels. No worrisome osseous lesion. Atherosclerosis with kissing carotids in the nasopharyngeal midline. No lung apex lesion. No definite neck masses.  IMPRESSION: No acute intracranial findings. No skull fracture or intracranial hemorrhage  No cervical spine fracture or traumatic subluxation.   Electronically Signed   By: Rolla Flatten M.D.   On: 07/01/2014 17:13   Ct Cervical Spine Wo Contrast  07/01/2014   CLINICAL DATA:  [place] and hit back of head earlier today. Head pain. Neck pain.  EXAM: CT HEAD WITHOUT CONTRAST  CT  CERVICAL SPINE WITHOUT CONTRAST  TECHNIQUE: Multidetector CT imaging of the head and cervical spine was performed following the standard protocol without intravenous contrast. Multiplanar CT image reconstructions of the cervical spine were also generated.  COMPARISON:  CT head 01/15/2009.  FINDINGS: CT HEAD FINDINGS  No evidence for acute infarction, hemorrhage, mass lesion, hydrocephalus, or extra-axial fluid. Generalized atrophy not unexpected for age 81. Chronic microvascular ischemic change affects periventricular and subcortical white matter. No calvarial lesions. No skull fracture. Vascular calcification. No sinus or mastoid air fluid level.  CT CERVICAL SPINE FINDINGS  There is no visible cervical spine fracture, traumatic subluxation, prevertebral soft tissue swelling, or intraspinal hematoma. Ten swan-neck deformity related to degenerative change. Severe disc space narrowing C5-6 and C6-7. Advanced facet arthropathy at multiple levels. No worrisome osseous lesion. Atherosclerosis with kissing carotids in the nasopharyngeal midline. No lung apex lesion. No definite neck masses.  IMPRESSION: No acute intracranial findings. No skull fracture or intracranial hemorrhage  No cervical spine fracture or traumatic subluxation.   Electronically Signed   By: Rolla Flatten M.D.   On: 07/01/2014 17:13   Dg Hip Unilat With Pelvis 2-3 Views Left  07/01/2014    CLINICAL DATA:  Fall.  EXAM: LEFT HIP (WITH PELVIS) 2-3 VIEWS  COMPARISON:  None.  FINDINGS: Left femoral neck fracture with foreshortening and angulation. Left hip joint is normal.  Chronic healed fractures of the right superior and inferior pubic rami. Right hip joint is normal. Lumbar fusion hardware noted.  IMPRESSION: Angulated fracture left femoral neck.   Electronically Signed   By: Franchot Gallo M.D.   On: 07/01/2014 16:49    EKG: Independently reviewed. Sinus rhythm, no ST-T wave change    Time spent:60 minutes Code Status:   FULL Family Communication:   Son updated at bedside   Jannell Franta, DO  [facility] Pager [phone]  If 7PM-7AM, please contact night-coverage [email or web address] Password [facility] 07/01/2014, 5:30 PM

## 2014-07-01 NOTE — Consult Note (Signed)
I have reviewed her films and tentative plan is for Left hip hemi arthroplasty tomorrow(sunday) am.    Formal consult to follow   Renette Butters Cell: [phone]

## 2014-07-02 ENCOUNTER — Inpatient Hospital Stay (HOSPITAL_COMMUNITY): Payer: Medicare Other | Admitting: Anesthesiology

## 2014-07-02 ENCOUNTER — Encounter (HOSPITAL_COMMUNITY)
Admission: EM | Disposition: A | Discharge status: Skilled Nursing Facility | Payer: Self-pay | Source: Home / Self Care | Attending: Internal Medicine

## 2014-07-02 ENCOUNTER — Inpatient Hospital Stay (HOSPITAL_COMMUNITY): Payer: Medicare Other

## 2014-07-02 DIAGNOSIS — S72002D Fracture of unspecified part of neck of left femur, subsequent encounter for closed fracture with routine healing: Secondary | ICD-10-CM

## 2014-07-02 HISTORY — PX: HIP ARTHROPLASTY: SHX981

## 2014-07-02 LAB — CBC
HCT: 38.7 % (ref 36.0–46.0)
Hemoglobin: 12 g/dL (ref 12.0–15.0)
MCH: 31.3 pg (ref 26.0–34.0)
MCHC: 31 g/dL (ref 30.0–36.0)
MCV: 101 fL — AB (ref 78.0–100.0)
PLATELETS: 302 10*3/uL (ref 150–400)
RBC: 3.83 MIL/uL — ABNORMAL LOW (ref 3.87–5.11)
RDW: 16.3 % — ABNORMAL HIGH (ref 11.5–15.5)
WBC: 14.6 10*3/uL — ABNORMAL HIGH (ref 4.0–10.5)

## 2014-07-02 LAB — GLUCOSE, CAPILLARY
Glucose-Capillary: 189 mg/dL — ABNORMAL HIGH (ref 70–99)
Glucose-Capillary: 141 mg/dL — ABNORMAL HIGH (ref 70–99)
Glucose-Capillary: 170 mg/dL — ABNORMAL HIGH (ref 70–99)
Glucose-Capillary: 166 mg/dL — ABNORMAL HIGH (ref 70–99)

## 2014-07-02 LAB — BASIC METABOLIC PANEL
Anion gap: 9 (ref 5–15)
BUN: 14 mg/dL (ref 6–23)
CALCIUM: 8.3 mg/dL — AB (ref 8.4–10.5)
CO2: 27 mmol/L (ref 19–32)
Chloride: 103 mmol/L (ref 96–112)
Creatinine, Ser: 1.11 mg/dL — ABNORMAL HIGH (ref 0.50–1.10)
GFR calc Af Amer: 54 mL/min — ABNORMAL LOW (ref >90)
GFR calc non Af Amer: 46 mL/min — ABNORMAL LOW (ref >90)
Glucose, Bld: 213 mg/dL — ABNORMAL HIGH (ref 70–99)
Potassium: 4.5 mmol/L (ref 3.5–5.1)
Sodium: 139 mmol/L (ref 135–145)

## 2014-07-02 SURGERY — HEMIARTHROPLASTY, HIP, DIRECT ANTERIOR APPROACH, FOR FRACTURE
Anesthesia: General | Laterality: Left

## 2014-07-02 MED ORDER — GLYCOPYRROLATE 0.2 MG/ML IJ SOLN
INTRAMUSCULAR | Status: DC | PRN
  Administered 2014-07-02: .6 mg via INTRAVENOUS
  Administered 2014-07-02: 0.4 mg via INTRAVENOUS

## 2014-07-02 MED ORDER — MENTHOL 3 MG MT LOZG: 1.0000 | LOZENGE | OROMUCOSAL | Status: DC | PRN

## 2014-07-02 MED ORDER — ONDANSETRON HCL 4 MG/2ML IJ SOLN
INTRAMUSCULAR | Status: DC | PRN
  Administered 2014-07-02: 4 mg via INTRAVENOUS

## 2014-07-02 MED ORDER — METOCLOPRAMIDE HCL 5 MG/ML IJ SOLN: 5.0000 mg | Freq: Three times a day (TID) | INTRAMUSCULAR | Status: DC | PRN

## 2014-07-02 MED ORDER — NEOSTIGMINE METHYLSULFATE 10 MG/10ML IV SOLN
INTRAVENOUS | Status: AC
  Filled 2014-07-02: qty 2

## 2014-07-02 MED ORDER — FENTANYL CITRATE (PF) 250 MCG/5ML IJ SOLN
INTRAMUSCULAR | Status: AC
  Filled 2014-07-02: qty 5

## 2014-07-02 MED ORDER — ONDANSETRON HCL 4 MG/2ML IJ SOLN
INTRAMUSCULAR | Status: AC
  Filled 2014-07-02: qty 4

## 2014-07-02 MED ORDER — ARTIFICIAL TEARS OP OINT
TOPICAL_OINTMENT | OPHTHALMIC | Status: DC | PRN
  Administered 2014-07-02: 1 via OPHTHALMIC

## 2014-07-02 MED ORDER — LACTATED RINGERS IV SOLN
INTRAVENOUS | Status: DC | PRN
  Administered 2014-07-02 (×2): via INTRAVENOUS

## 2014-07-02 MED ORDER — SUCCINYLCHOLINE CHLORIDE 20 MG/ML IJ SOLN
INTRAMUSCULAR | Status: AC
  Filled 2014-07-02: qty 1

## 2014-07-02 MED ORDER — LIDOCAINE HCL (CARDIAC) 20 MG/ML IV SOLN
INTRAVENOUS | Status: AC
  Filled 2014-07-02: qty 5

## 2014-07-02 MED ORDER — EPHEDRINE SULFATE 50 MG/ML IJ SOLN
INTRAMUSCULAR | Status: DC | PRN
  Administered 2014-07-02: 10 mg via INTRAVENOUS

## 2014-07-02 MED ORDER — ARTIFICIAL TEARS OP OINT
TOPICAL_OINTMENT | OPHTHALMIC | Status: AC
  Filled 2014-07-02: qty 3.5

## 2014-07-02 MED ORDER — FENTANYL CITRATE (PF) 100 MCG/2ML IJ SOLN
INTRAMUSCULAR | Status: AC
  Filled 2014-07-02: qty 2

## 2014-07-02 MED ORDER — GLYCOPYRROLATE 0.2 MG/ML IJ SOLN
INTRAMUSCULAR | Status: AC
  Filled 2014-07-02: qty 6

## 2014-07-02 MED ORDER — CEFAZOLIN SODIUM-DEXTROSE 2-3 GM-% IV SOLR
INTRAVENOUS | Status: AC
  Filled 2014-07-02: qty 100

## 2014-07-02 MED ORDER — PHENYLEPHRINE HCL 10 MG/ML IJ SOLN
INTRAMUSCULAR | Status: DC | PRN
  Administered 2014-07-02 (×2): 80 ug via INTRAVENOUS

## 2014-07-02 MED ORDER — ACETAMINOPHEN 650 MG RE SUPP: 650.0000 mg | Freq: Four times a day (QID) | RECTAL | Status: DC | PRN

## 2014-07-02 MED ORDER — 0.9 % SODIUM CHLORIDE (POUR BTL) OPTIME
TOPICAL | Status: DC | PRN
  Administered 2014-07-02: 1000 mL

## 2014-07-02 MED ORDER — DEXAMETHASONE SODIUM PHOSPHATE 10 MG/ML IJ SOLN
INTRAMUSCULAR | Status: DC | PRN
  Administered 2014-07-02: 8 mg via INTRAVENOUS

## 2014-07-02 MED ORDER — ACETAMINOPHEN 325 MG PO TABS: 650.0000 mg | ORAL_TABLET | Freq: Four times a day (QID) | ORAL | Status: DC | PRN

## 2014-07-02 MED ORDER — HYDROCODONE-ACETAMINOPHEN 5-325 MG PO TABS: 1.0000 | ORAL_TABLET | Freq: Four times a day (QID) | ORAL | Status: AC | PRN

## 2014-07-02 MED ORDER — PROPOFOL 10 MG/ML IV BOLUS
INTRAVENOUS | Status: DC | PRN
  Administered 2014-07-02: 110 mg via INTRAVENOUS

## 2014-07-02 MED ORDER — ONDANSETRON HCL 4 MG PO TABS
4.0000 mg | ORAL_TABLET | Freq: Four times a day (QID) | ORAL | Status: DC | PRN
  Administered 2014-07-05: 4 mg via ORAL
  Filled 2014-07-02: qty 1

## 2014-07-02 MED ORDER — PROPOFOL 10 MG/ML IV BOLUS
INTRAVENOUS | Status: AC
  Filled 2014-07-02: qty 20

## 2014-07-02 MED ORDER — ASPIRIN 325 MG PO TABS: 325.0000 mg | ORAL_TABLET | Freq: Every day | ORAL | Status: DC

## 2014-07-02 MED ORDER — ONDANSETRON HCL 4 MG/2ML IJ SOLN
4.0000 mg | Freq: Four times a day (QID) | INTRAMUSCULAR | Status: DC | PRN
  Administered 2014-07-02: 4 mg via INTRAVENOUS

## 2014-07-02 MED ORDER — FENTANYL CITRATE (PF) 100 MCG/2ML IJ SOLN
25.0000 ug | INTRAMUSCULAR | Status: DC | PRN
  Administered 2014-07-02 (×2): 50 ug via INTRAVENOUS

## 2014-07-02 MED ORDER — PHENOL 1.4 % MT LIQD: 1.0000 | OROMUCOSAL | Status: DC | PRN

## 2014-07-02 MED ORDER — GLYCOPYRROLATE 0.2 MG/ML IJ SOLN
INTRAMUSCULAR | Status: AC
  Filled 2014-07-02: qty 4

## 2014-07-02 MED ORDER — FENTANYL CITRATE (PF) 100 MCG/2ML IJ SOLN
INTRAMUSCULAR | Status: DC | PRN
  Administered 2014-07-02: 50 ug via INTRAVENOUS
  Administered 2014-07-02: 100 ug via INTRAVENOUS
  Administered 2014-07-02 (×2): 50 ug via INTRAVENOUS

## 2014-07-02 MED ORDER — LIDOCAINE HCL (CARDIAC) 20 MG/ML IV SOLN
INTRAVENOUS | Status: DC | PRN
  Administered 2014-07-02: 50 mg via INTRAVENOUS

## 2014-07-02 MED ORDER — ASPIRIN EC 325 MG PO TBEC
325.0000 mg | DELAYED_RELEASE_TABLET | Freq: Every day | ORAL | Status: DC
  Administered 2014-07-03 – 2014-07-04 (×2): 325 mg via ORAL
  Filled 2014-07-02 (×2): qty 1

## 2014-07-02 MED ORDER — CEFAZOLIN SODIUM-DEXTROSE 2-3 GM-% IV SOLR
2.0000 g | Freq: Four times a day (QID) | INTRAVENOUS | Status: AC
  Administered 2014-07-02 (×2): 2 g via INTRAVENOUS
  Filled 2014-07-02 (×2): qty 50

## 2014-07-02 MED ORDER — ONDANSETRON HCL 4 MG/2ML IJ SOLN
4.0000 mg | Freq: Once | INTRAMUSCULAR | Status: AC | PRN
  Filled 2014-07-02: qty 2

## 2014-07-02 MED ORDER — EPHEDRINE SULFATE 50 MG/ML IJ SOLN
INTRAMUSCULAR | Status: AC
  Filled 2014-07-02: qty 2

## 2014-07-02 MED ORDER — METOCLOPRAMIDE HCL 5 MG PO TABS: 5.0000 mg | ORAL_TABLET | Freq: Three times a day (TID) | ORAL | Status: DC | PRN

## 2014-07-02 MED ORDER — SODIUM CHLORIDE 0.9 % IJ SOLN
INTRAMUSCULAR | Status: AC
  Filled 2014-07-02: qty 20

## 2014-07-02 MED ORDER — PHENYLEPHRINE 40 MCG/ML (10ML) SYRINGE FOR IV PUSH (FOR BLOOD PRESSURE SUPPORT)
PREFILLED_SYRINGE | INTRAVENOUS | Status: AC
  Filled 2014-07-02: qty 10

## 2014-07-02 MED ORDER — ROCURONIUM BROMIDE 50 MG/5ML IV SOLN
INTRAVENOUS | Status: AC
  Filled 2014-07-02: qty 2

## 2014-07-02 MED ORDER — ROCURONIUM BROMIDE 100 MG/10ML IV SOLN
INTRAVENOUS | Status: DC | PRN
  Administered 2014-07-02: 35 mg via INTRAVENOUS

## 2014-07-02 MED ORDER — NEOSTIGMINE METHYLSULFATE 10 MG/10ML IV SOLN
INTRAVENOUS | Status: DC | PRN
  Administered 2014-07-02: 3 mg via INTRAVENOUS

## 2014-07-02 SURGICAL SUPPLY — 51 items
BIT DRILL 7/64X5 DISP (BIT) ×3 IMPLANT
BLADE SAGITTAL 25.0X1.27X90 (BLADE) ×2 IMPLANT
BLADE SAGITTAL 25.0X1.27X90MM (BLADE) ×1
CAPT HIP HEMI 2 ×3 IMPLANT
CLOSURE STERI-STRIP 1/2X4 (GAUZE/BANDAGES/DRESSINGS) ×2
CLSR STERI-STRIP ANTIMIC 1/2X4 (GAUZE/BANDAGES/DRESSINGS) ×4 IMPLANT
COVER SURGICAL LIGHT HANDLE (MISCELLANEOUS) ×3 IMPLANT
DRAPE IMP U-DRAPE 54X76 (DRAPES) ×3 IMPLANT
DRAPE ORTHO SPLIT 77X108 STRL (DRAPES) ×4
DRAPE SURG ORHT 6 SPLT 77X108 (DRAPES) ×2 IMPLANT
DRAPE U-SHAPE 47X51 STRL (DRAPES) ×3 IMPLANT
DRSG MEPILEX BORDER 4X8 (GAUZE/BANDAGES/DRESSINGS) ×3 IMPLANT
DURAPREP 26ML APPLICATOR (WOUND CARE) ×3 IMPLANT
ELECT CAUTERY BLADE 6.4 (BLADE) ×3 IMPLANT
ELECT REM PT RETURN 9FT ADLT (ELECTROSURGICAL) ×3
ELECTRODE REM PT RTRN 9FT ADLT (ELECTROSURGICAL) ×1 IMPLANT
FACESHIELD WRAPAROUND (MASK) ×3 IMPLANT
GLOVE BIO SURGEON STRL SZ7 (GLOVE) ×3 IMPLANT
GLOVE BIO SURGEON STRL SZ7.5 (GLOVE) IMPLANT
GLOVE BIOGEL PI IND STRL 7.0 (GLOVE) ×1 IMPLANT
GLOVE BIOGEL PI IND STRL 8 (GLOVE) ×1 IMPLANT
GLOVE BIOGEL PI INDICATOR 7.0 (GLOVE) ×2
GLOVE BIOGEL PI INDICATOR 8 (GLOVE) ×2
GOWN STRL REUS W/ TWL LRG LVL3 (GOWN DISPOSABLE) ×1 IMPLANT
GOWN STRL REUS W/ TWL XL LVL3 (GOWN DISPOSABLE) ×1 IMPLANT
GOWN STRL REUS W/TWL LRG LVL3 (GOWN DISPOSABLE) ×2
GOWN STRL REUS W/TWL XL LVL3 (GOWN DISPOSABLE) ×2
HIP CAPITATED HEMI 2 ×1 IMPLANT
KIT BASIN OR (CUSTOM PROCEDURE TRAY) ×3 IMPLANT
KIT ROOM TURNOVER OR (KITS) ×3 IMPLANT
MANIFOLD NEPTUNE II (INSTRUMENTS) ×3 IMPLANT
NS IRRIG 1000ML POUR BTL (IV SOLUTION) ×3 IMPLANT
PACK TOTAL JOINT (CUSTOM PROCEDURE TRAY) ×3 IMPLANT
PACK UNIVERSAL I (CUSTOM PROCEDURE TRAY) ×3 IMPLANT
PAD ARMBOARD 7.5X6 YLW CONV (MISCELLANEOUS) ×6 IMPLANT
PILLOW ABDUCTION HIP (SOFTGOODS) ×3 IMPLANT
RETRIEVER SUT HEWSON (MISCELLANEOUS) ×3 IMPLANT
SUT FIBERWIRE #2 38 REV NDL BL (SUTURE) ×6
SUT MNCRL AB 4-0 PS2 18 (SUTURE) ×3 IMPLANT
SUT MON AB 2-0 CT1 36 (SUTURE) ×3 IMPLANT
SUT VIC AB 0 CT1 27 (SUTURE) ×4
SUT VIC AB 0 CT1 27XBRD ANBCTR (SUTURE) ×2 IMPLANT
SUT VIC AB 0 CTX 36 (SUTURE) ×2
SUT VIC AB 0 CTX36XBRD ANTBCTR (SUTURE) ×1 IMPLANT
SUT VIC AB 1 CT1 27 (SUTURE) ×6
SUT VIC AB 1 CT1 27XBRD ANBCTR (SUTURE) ×3 IMPLANT
SUTURE FIBERWR#2 38 REV NDL BL (SUTURE) ×2 IMPLANT
TOWEL OR 17X24 6PK STRL BLUE (TOWEL DISPOSABLE) ×3 IMPLANT
TOWEL OR 17X26 10 PK STRL BLUE (TOWEL DISPOSABLE) ×3 IMPLANT
TRAY FOLEY CATH 14FR (SET/KITS/TRAYS/PACK) IMPLANT
WATER STERILE IRR 1000ML POUR (IV SOLUTION) ×6 IMPLANT

## 2014-07-02 NOTE — Anesthesia Procedure Notes (Signed)
Procedure Name: Intubation Date/Time: 07/02/2014 7:41 AM Performed by: Neldon Newport Pre-anesthesia Checklist: Patient identified, Timeout performed, Emergency Drugs available, Suction available and Patient being monitored Patient Re-evaluated:Patient Re-evaluated prior to inductionOxygen Delivery Method: Circle system utilized, Simple face mask, Non-rebreather mask, Nasal cannula and Ambu bag Preoxygenation: Pre-oxygenation with 100% oxygen Intubation Type: IV induction Ventilation: Mask ventilation without difficulty Laryngoscope Size: Mac and 3 Grade View: Grade I Tube type: Oral Tube size: 7.0 mm Number of attempts: 1 Placement Confirmation: positive ETCO2,  breath sounds checked- equal and bilateral and ETT inserted through vocal cords under direct vision Secured at: 21 cm Tube secured with: Tape Dental Injury: Teeth and Oropharynx as per pre-operative assessment

## 2014-07-02 NOTE — Progress Notes (Signed)
PT Cancellation Note  Patient Details Name: Monica Evans MRN: [id number] DOB: [birth year]   Cancelled Treatment:     Surgery this morning, and currently still in PACU;  Will follow up for PT eval tomorrow;   Thanks,  Roney Marion, PT  Acute [facility] Services Pager [phone] Office [phone]    [place] 07/02/2014, 10:42 AM

## 2014-07-02 NOTE — Transfer of Care (Signed)
Immediate Anesthesia Transfer of Care Note  Patient: Monica Evans  Procedure(s) Performed: Procedure(s): ARTHROPLASTY BIPOLAR HIP (Left)  Patient Location: PACU  Anesthesia Type:General  Level of Consciousness: awake, alert  and oriented  Airway & Oxygen Therapy: Patient Spontanous Breathing and Patient connected to face mask oxygen  Post-op Assessment: Report given to RN, Post -op Vital signs reviewed and stable and Patient moving all extremities X 4  Post vital signs: Reviewed and stable  Last Vitals:  Filed Vitals:   07/02/14 0440  BP: 154/66  Pulse: 88  Temp: 37.2 C  Resp: 20    Complications: No apparent anesthesia complications

## 2014-07-02 NOTE — Op Note (Signed)
07/01/2014 - 07/02/2014  8:44 AM  PATIENT:  Monica Evans   MRN: [id number]  PRE-OPERATIVE DIAGNOSIS:  Left hip fracture  POST-OPERATIVE DIAGNOSIS:  Left hip fracture  PROCEDURE:  Procedure(s): ARTHROPLASTY BIPOLAR HIP  PREOPERATIVE INDICATIONS:  Monica Evans is an 80 y.o. female who was admitted 07/01/2014 with a diagnosis of <principal problem not specified> and elected for surgical management.  The risks benefits and alternatives were discussed with the patient including but not limited to the risks of nonoperative treatment, versus surgical intervention including infection, bleeding, nerve injury, periprosthetic fracture, the need for revision surgery, dislocation, leg length discrepancy, blood clots, cardiopulmonary complications, morbidity, mortality, among others, and they were willing to proceed.  Predicted outcome is good, although there will be at least a six to nine month expected recovery.   OPERATIVE REPORT     SURGEON:  Edmonia Lynch, MD    ASSISTANT:  Lovett Calender, PA-C, She was present and scrubbed throughout the case, critical for completion in a timely fashion, and for retraction, instrumentation, and closure.     ANESTHESIA:  General    COMPLICATIONS:  None.      COMPONENTS:  [employer]: Femoral stem: 3.5, Femoral Head:46, Neck:+4   PROCEDURE IN DETAIL: The patient was met in the holding area and identified.  The appropriate hip  was marked at the operative site. The patient was then transported to the OR and  placed under general anesthesia.  At that point, the patient was  placed in the lateral decubitus position with the operative side up and  secured to the operating room table and all bony prominences padded.     The operative lower extremity was prepped from the iliac crest to the toes.  Sterile draping was performed.  Time out was performed prior to incision.      A routine posterolateral approach was utilized via sharp dissection  carried down to  the subcutaneous tissue.  Gross bleeders were Bovie  coagulated.  The iliotibial band was identified and incised  along the length of the skin incision.  Self-retaining retractors were  inserted.  With the hip internally rotated, the short external rotators  were identified. The piriformis was tagged with FiberWire, and the hip capsule released in a T-type fashion.  The femoral neck was exposed, and I resected the femoral neck using the appropriate jig. This was performed at approximately a thumb's breadth above the lesser trochanter.    I then exposed the deep acetabulum, cleared out any tissue including the ligamentum teres, and included the hip capsule in the FiberWire used above and below the T.    I then prepared the proximal femur using the cookie-cutter, the lateralizing reamer, and then sequentially broached.  A trial utilized, and I reduced the hip and it was found to have excellent stability with functional range of motion. The trial components were then removed.   The canal and acetabulum were thoroughly irrigated  I inserted the pressfit stem and placed the head and neck collar. The hip was reduced with appropriate force and was stable through a range of motion.   I then used a 2 mm drill bits to pass the FiberWire suture from the capsule and puriform is through the greater trochanter, and secured this. Excellent posterior capsular repair was achieved. I also closed the T in the capsule.  I then irrigated the hip copiously again with pulse lavage, and repaired the fascia with Vicryl, followed by Vicryl for the subcutaneous tissue, Monocryl for the skin,  Steri-Strips and sterile gauze. The wounds were injected. The patient was then awakened and returned to PACU in stable and satisfactory condition. There were no complications.  POST-OP PLAN: Weight bearing as tolerated. DVT px will consist of SCD's and chemical px  Edmonia Lynch, MD Orthopedic Surgeon [phone]   07/02/2014 8:44  AM   This note was generated using a template and dragon dictation system. In light of that, I have reviewed the note and all aspects of it are applicable to this case. Any dictation errors are due to the computerized dictation system.

## 2014-07-02 NOTE — Clinical Social Work Placement (Deleted)
   CLINICAL SOCIAL WORK PLACEMENT  NOTE  Date:  07/02/2014  Patient Details  Name: Mariavictoria Nottingham MRN: [id number] Date of Birth: 1934/04/02  Clinical Social Work is seeking post-discharge placement for this patient at the   level of care (*CSW will initial, date and re-position this form in  chart as items are completed):      Patient/family provided with [place] Work Department's list of facilities offering this level of care within the geographic area requested by the patient (or if unable, by the patient's family).      Patient/family informed of their freedom to choose among providers that offer the needed level of care, that participate in Medicare, Medicaid or managed care program needed by the patient, have an available bed and are willing to accept the patient.      Patient/family informed of [facility]'s ownership interest in [facility] and [facility], as well as of the fact that they are under no obligation to receive care at these facilities.  PASRR submitted to EDS on       PASRR number received on       Existing PASRR number confirmed on       FL2 transmitted to all facilities in geographic area requested by pt/family on       FL2 transmitted to all facilities within larger geographic area on       Patient informed that his/her managed care company has contracts with or will negotiate with certain facilities, including the following:            Patient/family informed of bed offers received.  Patient chooses bed at       Physician recommends and patient chooses bed at      Patient to be transferred to   on  .  Patient to be transferred to facility by       Patient family notified on   of transfer.  Name of family member notified:        PHYSICIAN       Additional Comment:    _______________________________________________ Pete Pelt 07/02/2014, 4:37 PM

## 2014-07-02 NOTE — Progress Notes (Signed)
Patient Demographics  Monica Evans, is a 80 y.o. female, DOB - 1934/08/07, JSE:[id number]  Admit date - 07/01/2014   Admitting Physician Monica Eva, MD  Outpatient Primary MD for the patient is Monica Held, MD  LOS - 1   Chief Complaint  Patient presents with  . Fall        Subjective:   Monica Evans today has, No headache, No chest pain, No abdominal pain - No Nausea, No new weakness tingling or numbness, No Cough - SOB.    Assessment & Plan    1. Mechanical fall with left femoral neck fracture. Status post open reduction internal fixation on 07/02/2014 by Monica Evans. Tolerated the procedure well. On Lovenox for DVT prophylaxis. Weightbearing as tolerated. Will require PT and evaluation for placement. Repeat H&H tomorrow.   2. DM type II. Diet controlled, on ISS here monitor.   3. Essential hypertension. On combination of verapamil and diuretic which will be continued.   4. History of eosinophilic pneumonia. On prednisone at home, continue.   5. Peripheral neuropathy. On Neurontin and tramadol continued.   6. Remote history of DVT. Finished her treatment with xaralto. Currently on prophylactic dose Lovenox.   7. Neck and left shoulder pain. CT neck unremarkable, shoulder has good range of motion and no tenderness except small bruise from the fall. Monitor.    Code Status: Full  Family Communication: family bedside  Disposition Plan: TBD   Procedures   ORIF L Femure 07-02-14 by Monica Evans   Consults  Ortho   Medications  Scheduled Meds: . [START ON 07/03/2014] aspirin EC  325 mg Oral Q breakfast  . calcium-vitamin D  1 tablet Oral BID  .  ceFAZolin (ANCEF) IV  2 g Intravenous Q6H  . enoxaparin (LOVENOX) injection  40 mg Subcutaneous Q24H  . fentaNYL      .  furosemide  40 mg Oral Daily  . gabapentin  800 mg Oral TID  . insulin aspart  0-9 Units Subcutaneous TID WC  . pantoprazole  40 mg Oral Daily  . potassium chloride  30 mEq Oral BID  . timolol  1 drop Left Eye BID  . verapamil  240 mg Oral QHS   Continuous Infusions:  PRN Meds:.acetaminophen **OR** acetaminophen, fentaNYL (SUBLIMAZE) injection, HYDROcodone-acetaminophen, morphine injection, morphine injection, ondansetron (ZOFRAN) IV, ondansetron **OR** ondansetron (ZOFRAN) IV, senna-docusate, traMADol  DVT Prophylaxis  Lovenox    Lab Results  Component Value Date   PLT 302 07/02/2014    Antibiotics   Anti-infectives    Start     Dose/Rate Route Frequency Ordered Stop   07/02/14 1115  ceFAZolin (ANCEF) IVPB 2 g/50 mL premix     2 g 100 mL/hr over 30 Minutes Intravenous Every 6 hours 07/02/14 1106 07/02/14 2314   07/02/14 0600  ceFAZolin (ANCEF) IVPB 2 g/50 mL premix     2 g 100 mL/hr over 30 Minutes Intravenous On call to O.R. 07/01/14 1859 07/02/14 0744          Objective:   Filed Vitals:   07/02/14 1000 07/02/14 1015 07/02/14 1030 07/02/14 1049  BP: 140/60 138/56 142/56 128/51  Pulse: 64 72 66 63  Temp:  98.2 F (36.8 C)  98.3 F (36.8 C)  TempSrc:    Oral  Resp: 14 12 11 14   Height:      Weight:      SpO2: 95% 100% 98% 100%    Wt Readings from Last 3 Encounters:  07/01/14 80.287 kg (177 lb)  10/03/13 81.103 kg (178 lb 12.8 oz)  08/29/13 79 kg (174 lb 2.6 oz)     Intake/Output Summary (Last 24 hours) at 07/02/14 1108 Last data filed at 07/02/14 1015  Gross per 24 hour  Intake 1596.67 ml  Output   1600 ml  Net  -3.33 ml     Physical Exam  Awake Alert, Oriented X 3, No new F.N deficits, Normal affect [name].AT,PERRAL Supple Neck,No JVD, No cervical lymphadenopathy appriciated.  Symmetrical Chest wall movement, Good air movement bilaterally, CTAB RRR,No Gallops,Rubs or new Murmurs, No Parasternal Heave +ve B.Sounds, Abd Soft, No tenderness, No  organomegaly appriciated, No rebound - guarding or rigidity. No Cyanosis, Clubbing or edema, No new Rash or bruise , L shoulder bruise     Data Review   Micro Results Recent Results (from the past 240 hour(s))  Surgical pcr screen     Status: Abnormal   Collection Time: 07/01/14  9:48 PM  Result Value Ref Range Status   MRSA, PCR NEGATIVE NEGATIVE Final   Staphylococcus aureus POSITIVE (A) NEGATIVE Final    Comment:        The Xpert SA Assay (FDA approved for NASAL specimens in patients over 68 years of age), is one component of a comprehensive surveillance program.  Test performance has been validated by [facility] for patients greater than or equal to 81 year old. It is not intended to diagnose infection nor to guide or monitor treatment.     Radiology Reports Dg Chest 1 View  07/01/2014   CLINICAL DATA:  Fall today  EXAM: CHEST  1 VIEW  COMPARISON:  09/30/2013  FINDINGS: Cardiac enlargement without heart failure. Atherosclerotic ectatic aorta.  Negative for pneumonia or edema. Small left effusion. No displaced rib fracture is identified. Advanced degenerative change right shoulder joint.  IMPRESSION: Small left pleural effusion.  No rib fracture is identified.   Electronically Signed   By: Franchot Gallo M.D.   On: 07/01/2014 16:48   Ct Head Wo Contrast  07/01/2014   CLINICAL DATA:  [place] and hit back of head earlier today. Head pain. Neck pain.  EXAM: CT HEAD WITHOUT CONTRAST  CT CERVICAL SPINE WITHOUT CONTRAST  TECHNIQUE: Multidetector CT imaging of the head and cervical spine was performed following the standard protocol without intravenous contrast. Multiplanar CT image reconstructions of the cervical spine were also generated.  COMPARISON:  CT head 01/15/2009.  FINDINGS: CT HEAD FINDINGS  No evidence for acute infarction, hemorrhage, mass lesion, hydrocephalus, or extra-axial fluid. Generalized atrophy not unexpected for age 65. Chronic microvascular ischemic change affects  periventricular and subcortical white matter. No calvarial lesions. No skull fracture. Vascular calcification. No sinus or mastoid air fluid level.  CT CERVICAL SPINE FINDINGS  There is no visible cervical spine fracture, traumatic subluxation, prevertebral soft tissue swelling, or intraspinal hematoma. Ten swan-neck deformity related to degenerative change. Severe disc space narrowing C5-6 and C6-7. Advanced facet arthropathy at multiple levels. No worrisome osseous lesion. Atherosclerosis with kissing carotids in the nasopharyngeal midline. No lung apex lesion. No definite neck masses.  IMPRESSION: No acute intracranial findings. No skull fracture or intracranial hemorrhage  No cervical spine fracture or traumatic subluxation.   Electronically Signed   By: Rolla Flatten  M.D.   On: 07/01/2014 17:13   Ct Cervical Spine Wo Contrast  07/01/2014   CLINICAL DATA:  [place] and hit back of head earlier today. Head pain. Neck pain.  EXAM: CT HEAD WITHOUT CONTRAST  CT CERVICAL SPINE WITHOUT CONTRAST  TECHNIQUE: Multidetector CT imaging of the head and cervical spine was performed following the standard protocol without intravenous contrast. Multiplanar CT image reconstructions of the cervical spine were also generated.  COMPARISON:  CT head 01/15/2009.  FINDINGS: CT HEAD FINDINGS  No evidence for acute infarction, hemorrhage, mass lesion, hydrocephalus, or extra-axial fluid. Generalized atrophy not unexpected for age 39. Chronic microvascular ischemic change affects periventricular and subcortical white matter. No calvarial lesions. No skull fracture. Vascular calcification. No sinus or mastoid air fluid level.  CT CERVICAL SPINE FINDINGS  There is no visible cervical spine fracture, traumatic subluxation, prevertebral soft tissue swelling, or intraspinal hematoma. Ten swan-neck deformity related to degenerative change. Severe disc space narrowing C5-6 and C6-7. Advanced facet arthropathy at multiple levels. No worrisome  osseous lesion. Atherosclerosis with kissing carotids in the nasopharyngeal midline. No lung apex lesion. No definite neck masses.  IMPRESSION: No acute intracranial findings. No skull fracture or intracranial hemorrhage  No cervical spine fracture or traumatic subluxation.   Electronically Signed   By: Rolla Flatten M.D.   On: 07/01/2014 17:13   Dg Hip Unilat With Pelvis 2-3 Views Left  07/01/2014   CLINICAL DATA:  Fall.  EXAM: LEFT HIP (WITH PELVIS) 2-3 VIEWS  COMPARISON:  None.  FINDINGS: Left femoral neck fracture with foreshortening and angulation. Left hip joint is normal.  Chronic healed fractures of the right superior and inferior pubic rami. Right hip joint is normal. Lumbar fusion hardware noted.  IMPRESSION: Angulated fracture left femoral neck.   Electronically Signed   By: Franchot Gallo M.D.   On: 07/01/2014 16:49     CBC  Recent Labs Lab 07/01/14 1550 07/02/14 0510  WBC 13.6* 14.6*  HGB 12.0 12.0  HCT 39.1 38.7  PLT 302 302  MCV 100.5* 101.0*  MCH 30.8 31.3  MCHC 30.7 31.0  RDW 16.1* 16.3*  LYMPHSABS 0.8  --   MONOABS 0.9  --   EOSABS 0.3  --   BASOSABS 0.1  --     Chemistries   Recent Labs Lab 07/01/14 1550 07/02/14 0510  NA 138 139  K 4.1 4.5  CL 100 103  CO2 30 27  GLUCOSE 158* 213*  BUN 17 14  CREATININE 1.07 1.11*  CALCIUM 8.9 8.3*   ------------------------------------------------------------------------------------------------------------------ estimated creatinine clearance is 39.2 mL/min (by C-G formula based on Cr of 1.11). ------------------------------------------------------------------------------------------------------------------ No results for input(s): HGBA1C in the last 72 hours. ------------------------------------------------------------------------------------------------------------------ No results for input(s): CHOL, HDL, LDLCALC, TRIG, CHOLHDL, LDLDIRECT in the last 72  hours. ------------------------------------------------------------------------------------------------------------------ No results for input(s): TSH, T4TOTAL, T3FREE, THYROIDAB in the last 72 hours.  Invalid input(s): FREET3 ------------------------------------------------------------------------------------------------------------------ No results for input(s): VITAMINB12, FOLATE, FERRITIN, TIBC, IRON, RETICCTPCT in the last 72 hours.  Coagulation profile  Recent Labs Lab 07/01/14 1550  INR 1.05    No results for input(s): DDIMER in the last 72 hours.  Cardiac Enzymes No results for input(s): CKMB, TROPONINI, MYOGLOBIN in the last 168 hours.  Invalid input(s): CK ------------------------------------------------------------------------------------------------------------------ Invalid input(s): POCBNP     Time Spent in minutes   35   SINGH,PRASHANT K M.D on 07/02/2014 at 11:08 AM  Between 7am to 7pm - Pager - [phone]  After 7pm go to [email or web address] - password [facility]  [facility]   Office  [phone]

## 2014-07-02 NOTE — Clinical Social Work Note (Signed)
Clinical Social Work Assessment  Patient Details  Name: Monica Evans MRN: [id number] Date of Birth: 12/26/1934  Date of referral:  07/02/14               Reason for consult:  Facility Placement                Permission sought to share information with:  Family Supports, Customer service manager Permission granted to share information::  Yes, Verbal Permission Granted  Name::     Tanganyika Bowlds     Agency::  [facility]   Relationship::  son   Contact Information:  [phone]  Housing/Transportation Living arrangements for the past 2 months:  [place] of Information:  Patient, Adult Children Patient Interpreter Needed:  None Criminal Activity/Legal Involvement Pertinent to Current Situation/Hospitalization:  No - Comment as needed Significant Relationships:  Adult Children, [place], Industrial/product designer, Other Family Members Lives with:  Adult Children Do you feel safe going back to the place where you live?  Yes Need for family participation in patient care:  No (Coment)  Care giving concerns:  No concerns at this time.   Social Worker assessment / plan:  CSW met with the Pt and adult children at the bedside. Pt was accepting of visit and is aware of the need for rehabilitation. Pt is wanting to go to [facility] as she was there 10 months ago. Pt likes to go by the name [name]. CSW will assist with SNF placement. CSW will send information out to [facility]   Employment status:  Disabled (Comment on whether or not currently receiving Disability) Insurance information:  Medicare, Managed Medicare PT Recommendations:    Information / Referral to community resources:  [place]  Patient/Family's Response to care: Family and Pt were appreciative for assistance with D/C planning.  Patient/Family's Understanding of and Emotional Response to Diagnosis, Current Treatment, and Prognosis: Pt is understanding of current health concerns.  Emotional  Assessment Appearance:  Developmentally appropriate, Well-Groomed Attitude/Demeanor/Rapport:    Affect (typically observed):  Accepting, Appropriate Orientation:  Oriented to Place, Oriented to Self, Oriented to  Time, Oriented to Situation Alcohol / Substance use:  Never Used Psych involvement (Current and /or in the community):  No (Comment)  Discharge Needs  Concerns to be addressed:  Discharge Planning Concerns Readmission within the last 30 days:  No Current discharge risk:  None Barriers to Discharge:  No Barriers Identified   Pete Pelt 07/02/2014, 4:37 PM

## 2014-07-02 NOTE — Anesthesia Preprocedure Evaluation (Addendum)
Anesthesia Evaluation  Patient identified by MRN, date of birth, ID band Patient awake    Reviewed: Allergy & Precautions, H&P , NPO status , Patient's Chart, lab work & pertinent test results  History of Anesthesia Complications (+) PONV and history of anesthetic complications  Airway Mallampati: II   Neck ROM: full    Dental  (+) Teeth Intact, Dental Advidsory Given   Pulmonary pneumonia -,  breath sounds clear to auscultation        Cardiovascular hypertension, Rhythm:regular Rate:Normal     Neuro/Psych  Neuromuscular disease    GI/Hepatic GERD-  Medicated and Controlled,  Endo/Other  diabetes, Type 2obese  Renal/GU      Musculoskeletal  (+) Arthritis -,   Abdominal   Peds  Hematology   Anesthesia Other Findings   Reproductive/Obstetrics                            Anesthesia Physical Anesthesia Plan  ASA: II  Anesthesia Plan: General   Post-op Pain Management:    Induction: Intravenous  Airway Management Planned: Oral ETT  Additional Equipment:   Intra-op Plan:   Post-operative Plan: Extubation in OR  Informed Consent: I have reviewed the patients History and Physical, chart, labs and discussed the procedure including the risks, benefits and alternatives for the proposed anesthesia with the patient or authorized representative who has indicated his/her understanding and acceptance.   Dental Advisory Given  Plan Discussed with: Anesthesiologist, CRNA and Surgeon  Anesthesia Plan Comments:        Anesthesia Quick Evaluation

## 2014-07-02 NOTE — Anesthesia Postprocedure Evaluation (Signed)
Anesthesia Post Note  Patient: Monica Evans  Procedure(s) Performed: Procedure(s) (LRB): ARTHROPLASTY BIPOLAR HIP (Left)  Anesthesia type: General  Patient location: PACU  Post pain: Pain level controlled and Adequate analgesia  Post assessment: Post-op Vital signs reviewed, Patient's Cardiovascular Status Stable, Respiratory Function Stable, Patent Airway and Pain level controlled  Last Vitals:  Filed Vitals:   07/02/14 1030  BP: 142/56  Pulse:   Temp:   Resp:     Post vital signs: Reviewed and stable  Level of consciousness: awake, alert  and oriented  Complications: No apparent anesthesia complications

## 2014-07-02 NOTE — Progress Notes (Signed)
Utilization review completed.  

## 2014-07-03 ENCOUNTER — Inpatient Hospital Stay (HOSPITAL_COMMUNITY): Payer: Medicare Other

## 2014-07-03 ENCOUNTER — Encounter (HOSPITAL_COMMUNITY): Payer: Self-pay | Admitting: Orthopedic Surgery

## 2014-07-03 LAB — GLUCOSE, CAPILLARY
GLUCOSE-CAPILLARY: 145 mg/dL — AB (ref 70–99)
Glucose-Capillary: 104 mg/dL — ABNORMAL HIGH (ref 70–99)
Glucose-Capillary: 86 mg/dL (ref 70–99)
Glucose-Capillary: 111 mg/dL — ABNORMAL HIGH (ref 70–99)
Glucose-Capillary: 100 mg/dL — ABNORMAL HIGH (ref 70–99)

## 2014-07-03 LAB — URINE MICROSCOPIC-ADD ON

## 2014-07-03 LAB — CBC
HCT: 32.8 % — ABNORMAL LOW (ref 36.0–46.0)
HEMOGLOBIN: 10.3 g/dL — AB (ref 12.0–15.0)
MCH: 31.9 pg (ref 26.0–34.0)
MCHC: 31.4 g/dL (ref 30.0–36.0)
MCV: 101.5 fL — ABNORMAL HIGH (ref 78.0–100.0)
Platelets: 253 10*3/uL (ref 150–400)
RBC: 3.23 MIL/uL — ABNORMAL LOW (ref 3.87–5.11)
RDW: 16.2 % — AB (ref 11.5–15.5)
WBC: 23.2 10*3/uL — ABNORMAL HIGH (ref 4.0–10.5)

## 2014-07-03 LAB — URINALYSIS, ROUTINE W REFLEX MICROSCOPIC
BILIRUBIN URINE: NEGATIVE
GLUCOSE, UA: NEGATIVE mg/dL
Hgb urine dipstick: NEGATIVE
Ketones, ur: NEGATIVE mg/dL
NITRITE: NEGATIVE
PH: 5 (ref 5.0–8.0)
Protein, ur: NEGATIVE mg/dL
SPECIFIC GRAVITY, URINE: 1.01 (ref 1.005–1.030)
UROBILINOGEN UA: 0.2 mg/dL (ref 0.0–1.0)

## 2014-07-03 LAB — HEMOGLOBIN A1C
Hgb A1c MFr Bld: 6.6 % — ABNORMAL HIGH (ref 4.8–5.6)
Mean Plasma Glucose: 143 mg/dL

## 2014-07-03 MED ORDER — RIVAROXABAN 20 MG PO TABS: 20.0000 mg | ORAL_TABLET | Freq: Every day | ORAL | Status: AC

## 2014-07-03 MED ORDER — GLUCERNA SHAKE PO LIQD
237.0000 mL | Freq: Two times a day (BID) | ORAL | Status: DC
  Administered 2014-07-04 – 2014-07-05 (×3): 237 mL via ORAL

## 2014-07-03 NOTE — Progress Notes (Signed)
Orthopedic Tech Progress Note Patient Details:  Monica Evans September 02, 1934 [id number]  Patient ID: Monica Evans, female   DOB: Jun 03, 1934, 80 y.o.   MRN: [id number] Pt unable to use trapeze bar patient helper  Hildred Priest 07/03/2014, 6:59 AM

## 2014-07-03 NOTE — Progress Notes (Signed)
Patient Demographics  Monica Evans, is a 80 y.o. female, DOB - 22-Jun-1934, EHO:[id number]  Admit date - 07/01/2014   Admitting Physician Monica Eva, MD  Outpatient Primary MD for the patient is Monica Held, MD  LOS - 2   Chief Complaint  Patient presents with  . Fall        Subjective:   Monica Evans today has, No headache, No chest pain, No abdominal pain - No Nausea, No new weakness tingling or numbness, No Cough - SOB.  Mild left-sided hip pain.  Assessment & Plan    1. Mechanical fall with left femoral neck fracture. Status post open reduction internal fixation on 07/02/2014 by Monica Evans. Tolerated the procedure well. On Lovenox for DVT prophylaxis. Mild perioperative blood loss related anemia not requiring transfusion. Weightbearing as tolerated. Will require PT and evaluation for placement. Repeat H&H tomorrow.   2. DM type II. Diet controlled, on ISS here monitor.  CBG (last 3)   Recent Labs  07/02/14 1610 07/02/14 2113 07/03/14 0642  GLUCAP 170* 166* 104*     3. Essential hypertension. On combination of verapamil and diuretic which will be continued.   4. History of eosinophilic pneumonia. Received IV steroid night of surgery now on On prednisone at home dose, continue.   5. Peripheral neuropathy. On Neurontin and tramadol continued.   6. Remote history of DVT. Finished her treatment with xaralto. Currently on prophylactic dose Lovenox.   7. Neck and left shoulder pain. CT neck unremarkable, shoulder has good range of motion and no tenderness except small bruise from the fall. Monitor.   8. Leukocytosis. Nonspecific would be due to steroid use at home and one dose of IV steroids she received here. Chest x-ray clear, will check UA, she is afebrile we will monitor  closely. Incentive spirometer and flutter valve added.    Code Status: Full  Family Communication: family bedside  Disposition Plan: TBD   Procedures   ORIF L Femure 07-02-14 by Monica Evans   Consults  Ortho   Medications  Scheduled Meds: . aspirin EC  325 mg Oral Q breakfast  . calcium-vitamin D  1 tablet Oral BID  . enoxaparin (LOVENOX) injection  40 mg Subcutaneous Q24H  . furosemide  40 mg Oral Daily  . gabapentin  800 mg Oral TID  . insulin aspart  0-9 Units Subcutaneous TID WC  . pantoprazole  40 mg Oral Daily  . potassium chloride  30 mEq Oral BID  . timolol  1 drop Left Eye BID  . verapamil  240 mg Oral QHS   Continuous Infusions:  PRN Meds:.acetaminophen **OR** acetaminophen, fentaNYL (SUBLIMAZE) injection, HYDROcodone-acetaminophen, morphine injection, morphine injection, ondansetron **OR** ondansetron (ZOFRAN) IV, senna-docusate, traMADol  DVT Prophylaxis  Lovenox    Lab Results  Component Value Date   PLT 253 07/03/2014    Antibiotics   Anti-infectives    Start     Dose/Rate Route Frequency Ordered Stop   07/02/14 1400  ceFAZolin (ANCEF) IVPB 2 g/50 mL premix     2 g 100 mL/hr over 30 Minutes Intravenous Every 6 hours 07/02/14 1106 07/02/14 2220   07/02/14 0600  ceFAZolin (ANCEF) IVPB 2 g/50 mL premix     2 g 100 mL/hr over  30 Minutes Intravenous On call to O.R. 07/01/14 1859 07/02/14 0744          Objective:   Filed Vitals:   07/02/14 2110 07/02/14 2331 07/03/14 0400 07/03/14 0640  BP: 124/48   115/43  Pulse: 78   65  Temp: 99.1 F (37.3 C)   98.5 F (36.9 C)  TempSrc: Oral   Oral  Resp: 18 18 16 16   Height:      Weight:      SpO2: 94% 94% 92% 92%    Wt Readings from Last 3 Encounters:  07/01/14 80.287 kg (177 lb)  10/03/13 81.103 kg (178 lb 12.8 oz)  08/29/13 79 kg (174 lb 2.6 oz)     Intake/Output Summary (Last 24 hours) at 07/03/14 1123 Last data filed at 07/03/14 0641  Gross per 24 hour  Intake    120 ml  Output     350 ml  Net   -230 ml     Physical Exam  Awake Alert, Oriented X 3, No new F.N deficits, Normal affect [place].AT,PERRAL Supple Neck,No JVD, No cervical lymphadenopathy appriciated.  Symmetrical Chest wall movement, Good air movement bilaterally, CTAB RRR,No Gallops,Rubs or new Murmurs, No Parasternal Heave +ve B.Sounds, Abd Soft, No tenderness, No organomegaly appriciated, No rebound - guarding or rigidity. No Cyanosis, Clubbing or edema, No new Rash or bruise , L shoulder bruise     Data Review   Micro Results Recent Results (from the past 240 hour(s))  Surgical pcr screen     Status: Abnormal   Collection Time: 07/01/14  9:48 PM  Result Value Ref Range Status   MRSA, PCR NEGATIVE NEGATIVE Final   Staphylococcus aureus POSITIVE (A) NEGATIVE Final    Comment:        The Xpert SA Assay (FDA approved for NASAL specimens in patients over 78 years of age), is one component of a comprehensive surveillance program.  Test performance has been validated by [facility] for patients greater than or equal to 29 year old. It is not intended to diagnose infection nor to guide or monitor treatment.     Radiology Reports Dg Chest 1 View  07/01/2014   CLINICAL DATA:  Fall today  EXAM: CHEST  1 VIEW  COMPARISON:  09/30/2013  FINDINGS: Cardiac enlargement without heart failure. Atherosclerotic ectatic aorta.  Negative for pneumonia or edema. Small left effusion. No displaced rib fracture is identified. Advanced degenerative change right shoulder joint.  IMPRESSION: Small left pleural effusion.  No rib fracture is identified.   Electronically Signed   By: Franchot Gallo M.D.   On: 07/01/2014 16:48   Ct Head Wo Contrast  07/01/2014   CLINICAL DATA:  [place] and hit back of head earlier today. Head pain. Neck pain.  EXAM: CT HEAD WITHOUT CONTRAST  CT CERVICAL SPINE WITHOUT CONTRAST  TECHNIQUE: Multidetector CT imaging of the head and cervical spine was performed following the standard protocol  without intravenous contrast. Multiplanar CT image reconstructions of the cervical spine were also generated.  COMPARISON:  CT head 01/15/2009.  FINDINGS: CT HEAD FINDINGS  No evidence for acute infarction, hemorrhage, mass lesion, hydrocephalus, or extra-axial fluid. Generalized atrophy not unexpected for age 4. Chronic microvascular ischemic change affects periventricular and subcortical white matter. No calvarial lesions. No skull fracture. Vascular calcification. No sinus or mastoid air fluid level.  CT CERVICAL SPINE FINDINGS  There is no visible cervical spine fracture, traumatic subluxation, prevertebral soft tissue swelling, or intraspinal hematoma. Ten swan-neck deformity related to  degenerative change. Severe disc space narrowing C5-6 and C6-7. Advanced facet arthropathy at multiple levels. No worrisome osseous lesion. Atherosclerosis with kissing carotids in the nasopharyngeal midline. No lung apex lesion. No definite neck masses.  IMPRESSION: No acute intracranial findings. No skull fracture or intracranial hemorrhage  No cervical spine fracture or traumatic subluxation.   Electronically Signed   By: Rolla Flatten M.D.   On: 07/01/2014 17:13   Ct Cervical Spine Wo Contrast  07/01/2014   CLINICAL DATA:  [place] and hit back of head earlier today. Head pain. Neck pain.  EXAM: CT HEAD WITHOUT CONTRAST  CT CERVICAL SPINE WITHOUT CONTRAST  TECHNIQUE: Multidetector CT imaging of the head and cervical spine was performed following the standard protocol without intravenous contrast. Multiplanar CT image reconstructions of the cervical spine were also generated.  COMPARISON:  CT head 01/15/2009.  FINDINGS: CT HEAD FINDINGS  No evidence for acute infarction, hemorrhage, mass lesion, hydrocephalus, or extra-axial fluid. Generalized atrophy not unexpected for age 72. Chronic microvascular ischemic change affects periventricular and subcortical white matter. No calvarial lesions. No skull fracture. Vascular  calcification. No sinus or mastoid air fluid level.  CT CERVICAL SPINE FINDINGS  There is no visible cervical spine fracture, traumatic subluxation, prevertebral soft tissue swelling, or intraspinal hematoma. Ten swan-neck deformity related to degenerative change. Severe disc space narrowing C5-6 and C6-7. Advanced facet arthropathy at multiple levels. No worrisome osseous lesion. Atherosclerosis with kissing carotids in the nasopharyngeal midline. No lung apex lesion. No definite neck masses.  IMPRESSION: No acute intracranial findings. No skull fracture or intracranial hemorrhage  No cervical spine fracture or traumatic subluxation.   Electronically Signed   By: Rolla Flatten M.D.   On: 07/01/2014 17:13   Pelvis Portable  07/02/2014   CLINICAL DATA:  Bipolar LEFT hip hemiarthroplasty placement.  EXAM: PORTABLE PELVIS 1-2 VIEWS  COMPARISON:  None.  FINDINGS: Single frontal view of the pelvis demonstrates a new LEFT bipolar hip hemiarthroplasty. Old RIGHT obturator ring fractures are present. Atherosclerosis. Lumbar PLIF. No complicating features. Distal femoral stem visible and normal. No lateral view is submitted. Expected postsurgical changes in soft tissues.  IMPRESSION: Uncomplicated new LEFT bipolar hip hemiarthroplasty.   Electronically Signed   By: Dereck Ligas M.D.   On: 07/02/2014 11:33   Dg Chest Port 1 View  07/03/2014   CLINICAL DATA:  Shortness of breath. Status post left hip replacement 07/02/2014.  EXAM: PORTABLE CHEST - 1 VIEW  COMPARISON:  Single view of the chest 07/01/2014.  FINDINGS: There is cardiomegaly without edema. The lungs are clear. No pneumothorax or pleural effusion. Severe degenerative disease right shoulder is noted.  IMPRESSION: Cardiomegaly without acute disease.   Electronically Signed   By: Inge Rise M.D.   On: 07/03/2014 08:08   Dg Hip Unilat With Pelvis 2-3 Views Left  07/01/2014   CLINICAL DATA:  Fall.  EXAM: LEFT HIP (WITH PELVIS) 2-3 VIEWS  COMPARISON:   None.  FINDINGS: Left femoral neck fracture with foreshortening and angulation. Left hip joint is normal.  Chronic healed fractures of the right superior and inferior pubic rami. Right hip joint is normal. Lumbar fusion hardware noted.  IMPRESSION: Angulated fracture left femoral neck.   Electronically Signed   By: Franchot Gallo M.D.   On: 07/01/2014 16:49     CBC  Recent Labs Lab 07/01/14 1550 07/02/14 0510 07/03/14 0501  WBC 13.6* 14.6* 23.2*  HGB 12.0 12.0 10.3*  HCT 39.1 38.7 32.8*  PLT 302 302 253  MCV 100.5* 101.0* 101.5*  MCH 30.8 31.3 31.9  MCHC 30.7 31.0 31.4  RDW 16.1* 16.3* 16.2*  LYMPHSABS 0.8  --   --   MONOABS 0.9  --   --   EOSABS 0.3  --   --   BASOSABS 0.1  --   --     Chemistries   Recent Labs Lab 07/01/14 1550 07/02/14 0510  NA 138 139  K 4.1 4.5  CL 100 103  CO2 30 27  GLUCOSE 158* 213*  BUN 17 14  CREATININE 1.07 1.11*  CALCIUM 8.9 8.3*   ------------------------------------------------------------------------------------------------------------------ estimated creatinine clearance is 39.2 mL/min (by C-G formula based on Cr of 1.11). ------------------------------------------------------------------------------------------------------------------ No results for input(s): HGBA1C in the last 72 hours. ------------------------------------------------------------------------------------------------------------------ No results for input(s): CHOL, HDL, LDLCALC, TRIG, CHOLHDL, LDLDIRECT in the last 72 hours. ------------------------------------------------------------------------------------------------------------------ No results for input(s): TSH, T4TOTAL, T3FREE, THYROIDAB in the last 72 hours.  Invalid input(s): FREET3 ------------------------------------------------------------------------------------------------------------------ No results for input(s): VITAMINB12, FOLATE, FERRITIN, TIBC, IRON, RETICCTPCT in the last 72 hours.  Coagulation  profile  Recent Labs Lab 07/01/14 1550  INR 1.05    No results for input(s): DDIMER in the last 72 hours.  Cardiac Enzymes No results for input(s): CKMB, TROPONINI, MYOGLOBIN in the last 168 hours.  Invalid input(s): CK ------------------------------------------------------------------------------------------------------------------ Invalid input(s): POCBNP   Time Spent in minutes   35   Dago Jungwirth K M.D on 07/03/2014 at 11:23 AM  Between 7am to 7pm - Pager - [phone]  After 7pm go to [email or web address] - password [facility]  [facility]   Office  [phone]

## 2014-07-03 NOTE — Progress Notes (Signed)
INITIAL NUTRITION ASSESSMENT  DOCUMENTATION CODES Per approved criteria  -Obesity Unspecified   INTERVENTION: Provide Glucerna Shake po BID, each supplement provides 220 kcal and 10 grams of protein.  Encourage adequate PO intake.  NUTRITION DIAGNOSIS: Increased nutrient needs related to s/p surgery as evidenced by estimated nutrition needs.   Goal: Pt to meet >/= 90% of their estimated nutrition needs   Monitor:  PO intake, weight trends, labs, I/O's  Reason for Assessment: MD consult  80 y.o. female  Admitting Dx: <principal problem not specified>  ASSESSMENT: Pt with a history of impaired glucose tolerance, hypertension, left lower extremity DVT, and eosinophilic pneumonia presented after a mechanical fall. X-ray of the pelvis revealed a left femoral neck fracture.   PROCEDURE (4/24): ARTHROPLASTY BIPOLAR HIP  Pt reports her appetite is currently fine. Meal completion has been 50-90%. Pt reports she has also been eating fine at meals with 2-3 meals a day along with an protein shake once daily. Weight has been stable. Pt is agreeable on Glucerna shake to aid in healing. RD to order. Pt was encouraged to continue to eat her food at meals.   Pt with no observed significant fat or muscle mass loss.   Labs and medications reviewed.  Height: Ht Readings from Last 1 Encounters:  07/01/14 5' (1.524 m)    Weight: Wt Readings from Last 1 Encounters:  07/01/14 177 lb (80.287 kg)    Ideal Body Weight: 100 lbs  % Ideal Body Weight: 177%  Wt Readings from Last 10 Encounters:  07/01/14 177 lb (80.287 kg)  10/03/13 178 lb 12.8 oz (81.103 kg)  08/29/13 174 lb 2.6 oz (79 kg)    Usual Body Weight: 177 lbs  % Usual Body Weight: 100%  BMI:  Body mass index is 34.57 kg/(m^2). Class I obesity  Estimated Nutritional Needs: Kcal: 1700-1850 Protein: 80-90 grams Fluid: 1.7 - 1.8 L/day  Skin: Incision on L hip, non-pitting LLE edema  Diet Order: Diet heart healthy/carb  modified Room service appropriate?: Yes; Fluid consistency:: Thin  EDUCATION NEEDS: -No education needs identified at this time   Intake/Output Summary (Last 24 hours) at 07/03/14 1004 Last data filed at 07/03/14 0641  Gross per 24 hour  Intake    120 ml  Output    500 ml  Net   -380 ml    Last BM: 4/23  Labs:   Recent Labs Lab 07/01/14 1550 07/02/14 0510  NA 138 139  K 4.1 4.5  CL 100 103  CO2 30 27  BUN 17 14  CREATININE 1.07 1.11*  CALCIUM 8.9 8.3*  GLUCOSE 158* 213*    CBG (last 3)   Recent Labs  07/02/14 1610 07/02/14 2113 07/03/14 0642  GLUCAP 170* 166* 104*    Scheduled Meds: . aspirin EC  325 mg Oral Q breakfast  . calcium-vitamin D  1 tablet Oral BID  . enoxaparin (LOVENOX) injection  40 mg Subcutaneous Q24H  . furosemide  40 mg Oral Daily  . gabapentin  800 mg Oral TID  . insulin aspart  0-9 Units Subcutaneous TID WC  . pantoprazole  40 mg Oral Daily  . potassium chloride  30 mEq Oral BID  . timolol  1 drop Left Eye BID  . verapamil  240 mg Oral QHS    Continuous Infusions:   Past Medical History  Diagnosis Date  . Diabetes mellitus without complication     prediabetes- HgbA1C- has been monitored  by PCP- Dr. Nicki Reaper- [facility]   .  Complication of anesthesia   . PONV (postoperative nausea and vomiting)     nausea  . Hypertension   . Pneumonia     hosp. many yrs. ago for pneumonia  . GERD (gastroesophageal reflux disease)   . Arthritis     + lumbar stenosis  . Blood dyscrasia     rare blood disease - since she was 37 yrs. old that has lead to chest/lung scarring .  Marland Kitchen Apical lung scarring     lung scarring related to disease that was diagnosed when she was 80 y.o.   . 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    . Glaucoma     left eye only  . DVT (deep venous thrombosis) 10/03/2013    LEFT LEG    Past Surgical History  Procedure Laterality Date  . Eye surgery Bilateral     IOL only  in R eye  . Tubal ligation    . Posterior fusion lumbar spine  08/29/2013    LEVEL 2   . Hip arthroplasty Left 07/02/2014    Procedure: ARTHROPLASTY BIPOLAR HIP;  Surgeon: Renette Butters, MD;  Location: [place];  Service: Orthopedics;  Laterality: Left;    Monica Locks, MS, RD, LDN Pager # [phone] After hours/ weekend pager # [phone]

## 2014-07-03 NOTE — Progress Notes (Addendum)
     Subjective:  POD#1 L hip arthroplasty.  Patient reports pain as mild to moderate.  Resting comfortably in bed.   Objective:   VITALS:   Filed Vitals:   07/02/14 2110 07/02/14 2331 07/03/14 0400 07/03/14 0640  BP: 124/48   115/43  Pulse: 78   65  Temp: 99.1 F (37.3 C)   98.5 F (36.9 C)  TempSrc: Oral   Oral  Resp: 18 18 16 16   Height:      Weight:      SpO2: 94% 94% 92% 92%    Neurologically intact ABD soft Neurovascular intact Sensation intact distally Intact pulses distally Dorsiflexion/Plantar flexion intact Incision: dressing C/D/I   Lab Results  Component Value Date   WBC 23.2* 07/03/2014   HGB 10.3* 07/03/2014   HCT 32.8* 07/03/2014   MCV 101.5* 07/03/2014   PLT 253 07/03/2014   BMET    Component Value Date/Time   NA 139 07/02/2014 0510   K 4.5 07/02/2014 0510   CL 103 07/02/2014 0510   CO2 27 07/02/2014 0510   GLUCOSE 213* 07/02/2014 0510   BUN 14 07/02/2014 0510   CREATININE 1.11* 07/02/2014 0510   CALCIUM 8.3* 07/02/2014 0510   GFRNONAA 46* 07/02/2014 0510   GFRAA 54* 07/02/2014 0510     Assessment/Plan: 1 Day Post-Op   Active Problems:   Left displaced femoral neck fracture   Essential hypertension   Impaired glucose tolerance   Peripheral neuropathy   Up with therapy WBAT in the RLE  Posterior hip precautions Patient has a history of DVT 09/2013 that was treated with Xarelto, PCP recommends resuming this upon discharge for 3 months. Will change discharge DVT planning to Xarelto 20mg  QD for 3 months.  Monica Evans Monica Evans 07/03/2014, 8:52 AM Cell [phone]

## 2014-07-03 NOTE — Progress Notes (Signed)
Rehab Admissions Coordinator Note:  Patient was screened by Cleatrice Burke for appropriateness for an Inpatient Acute Rehab Consult per OT recommendation.  At this time, we are recommending [place] per pt and family request as well as the orthopedic bundle plans.  Cleatrice Burke 07/03/2014, 1:03 PM  I can be reached at [phone].

## 2014-07-03 NOTE — Evaluation (Signed)
Physical Therapy Evaluation Patient Details Name: Monica Evans MRN: [id number] DOB: [birth year] Today's Date: 07/03/2014   History of Present Illness  Pt is a 80 y/o F s/p fall, L femoral neck fx now s/p ORIF on 07/02/14.  Pt's PMH includes DM, PONV, HTN, blood dyscrasia, glaucoma, and DVT in 2015.   Clinical Impression  Patient is s/p above surgery resulting in functional limitations due to the deficits listed below (see PT Problem List). Pt's son is only available for assist intermittently w/ an unpredictable work schedule.  At this time pt is unsafe to ambulate at home independently and will benefit from further therapy to increase functional independence.  Pt limited to ambulating 20 ft in room this session 2/2 pain in L hip and fatigue.  Patient will benefit from skilled PT to increase their independence and safety with mobility to allow discharge to the venue listed below.      Follow Up Recommendations SNF;Supervision for mobility/OOB    Equipment Recommendations  None recommended by PT    Recommendations for Other Services       Precautions / Restrictions Precautions Precautions: Posterior Hip;Fall Precaution Booklet Issued: Yes (comment) Precaution Comments: Reviewed post hip precautions Restrictions Weight Bearing Restrictions: Yes LLE Weight Bearing: Weight bearing as tolerated      Mobility  Bed Mobility Overal bed mobility: Needs Assistance Bed Mobility: Sit to Supine       Sit to supine: Min assist   General bed mobility comments: Min A w/ BLE's to bring into bed, cues for technique scooting to [facility]  Transfers Overall transfer level: Needs assistance Equipment used: Rolling walker (2 wheeled) Transfers: Sit to/from Stand Sit to Stand: Min assist         General transfer comment: Min A to stand upright.  Cues for hand placement and to push through BLEs to come to standing.  Verbal cues to reach back to bed when performing  stand>sit  Ambulation/Gait Ambulation/Gait assistance: Min guard Ambulation Distance (Feet): 20 Feet Assistive device: Rolling walker (2 wheeled) Gait Pattern/deviations: Step-to pattern;Decreased step length - right;Decreased stride length;Decreased stance time - left;Shuffle;Antalgic;Trunk flexed   Gait velocity interpretation: Below normal speed for age/gender General Gait Details: Trunk flexed despite verbal cues to stand upright.  Pt initially shuffling RLE but responded extremely well to verbal cues to bring R foot off of floor when stepping.  Pt limited by pain and fatigue.  Stairs            Wheelchair Mobility    Modified Rankin (Stroke Patients Only)       Balance Overall balance assessment: Needs assistance;History of Falls Sitting-balance support: No upper extremity supported;Feet supported Sitting balance-Leahy Scale: Fair     Standing balance support: Bilateral upper extremity supported;During functional activity Standing balance-Leahy Scale: Poor                               Pertinent Vitals/Pain Pain Assessment: 0-10 Pain Score: 6  Pain Location: L hip Pain Descriptors / Indicators: Constant;Grimacing;Guarding;Dull;Discomfort Pain Intervention(s): Limited activity within patient's tolerance;Monitored during session;Repositioned    Home Living Family/patient expects to be discharged to:: Skilled nursing facility Living Arrangements: Children (son) Available Help at Discharge: Family;Available PRN/intermittently (son works as Administrator (schedule is variable))   Home Access: Ramped entrance     [place]: Two level;Able to live on main level with bedroom/bathroom Home Equipment: Gilford Rile - 2 wheels;Wheelchair - manual;Cane - single point;Bedside commode;Toilet riser  Additional Comments: Pt with life alert    Prior Function Level of Independence: Independent               Hand Dominance   Dominant Hand: Right     Extremity/Trunk Assessment   Upper Extremity Assessment: Defer to OT evaluation           Lower Extremity Assessment: LLE deficits/detail;Generalized weakness;RLE deficits/detail   LLE Deficits / Details: weakness and limited ROM as expected s/p surgery  Cervical / Trunk Assessment: Kyphotic  Communication   Communication: No difficulties  Cognition Arousal/Alertness: Awake/alert Behavior During Therapy: WFL for tasks assessed/performed Overall Cognitive Status: Within Functional Limits for tasks assessed                      General Comments      Exercises Total Joint Exercises Ankle Circles/Pumps: AROM;Both;10 reps;Supine Quad Sets: AROM;Both;10 reps;Supine Hip ABduction/ADduction: AAROM;Left;5 reps;Supine Long Arc Quad: AROM;Both;10 reps;Seated      Assessment/Plan    PT Assessment Patient needs continued PT services  PT Diagnosis Difficulty walking;Abnormality of gait;Generalized weakness;Acute pain   PT Problem List Decreased strength;Decreased range of motion;Decreased activity tolerance;Decreased balance;Decreased mobility;Decreased coordination;Decreased knowledge of use of DME;Decreased safety awareness;Decreased knowledge of precautions;Impaired sensation;Decreased skin integrity;Pain  PT Treatment Interventions DME instruction;Gait training;Stair training;Functional mobility training;Therapeutic activities;Therapeutic exercise;Balance training;Neuromuscular re-education;Patient/family education;Modalities   PT Goals (Current goals can be found in the Care Plan section) Acute Rehab PT Goals Patient Stated Goal: get stronger PT Goal Formulation: With patient Time For Goal Achievement: 07/03/14 Potential to Achieve Goals: Good    Frequency Min 3X/week   Barriers to discharge Decreased caregiver support Son available intermittently and unpredictable schedule    Co-evaluation               End of Session Equipment Utilized During Treatment:  Gait belt Activity Tolerance: Patient limited by fatigue;Patient tolerated treatment well;Patient limited by pain Patient left: in bed;with call bell/phone within reach;with SCD's reapplied (w/ pillow between LEs) Nurse Communication: Mobility status;Precautions;Weight bearing status         Time: 7846-9629 PT Time Calculation (min) (ACUTE ONLY): 41 min   Charges:   PT Evaluation $Initial PT Evaluation Tier I: 1 Procedure PT Treatments $Gait Training: 8-22 mins $Therapeutic Exercise: 8-22 mins   PT G Codes:       Joslyn Hy PT, DPT [phone] Pager: [phone] 07/03/2014, 3:20 PM

## 2014-07-03 NOTE — Evaluation (Signed)
Occupational Therapy Evaluation Patient Details Name: Monica Evans MRN: [id number] DOB: [birth year] Today's Date: 07/03/2014    History of Present Illness 80 yo female. Patient with mechanical fall with left femoral neck fracture. Status post open reduction internal fixation on 07/02/2014.    Clinical Impression   Patient independent PTA. Patient currently requires up to max assist for bed mobility and LB ADLs, mod assist for functional transfers, min guard for dynamic sitting EOB and UB ADLs. Patient will benefit from acute OT to increase overall independence in the areas of ADLs, functional mobility, and overall safety in order to safely discharge to venue listed below.     Follow Up Recommendations  CIR;Supervision/Assistance - 24 hour    Equipment Recommendations  Other (comment) (TBD next venue of care)    Recommendations for Other Services Rehab consult     Precautions / Restrictions Precautions Precautions: Fall Restrictions Weight Bearing Restrictions: Yes LLE Weight Bearing: Weight bearing as tolerated      Mobility Bed Mobility Overal bed mobility: Needs Assistance Bed Mobility: Rolling;Sidelying to Sit Rolling: Mod assist Sidelying to sit: Max assist       General bed mobility comments: Cues for technique and safety. Patient with decreased strength and ROM, limiting her ability to be more independent with this task.   Transfers Overall transfer level: Needs assistance Equipment used: Rolling walker (2 wheeled) Transfers: Sit to/from [facility] Sit to Stand: Mod assist Stand pivot transfers: Mod assist       General transfer comment: Cues for hand placement, safety, and technique.     Balance Overall balance assessment: Needs assistance Sitting-balance support: Feet supported;No upper extremity supported Sitting balance-Leahy Scale: Poor     Standing balance support: Bilateral upper extremity supported;During functional  activity Standing balance-Leahy Scale: Poor    ADL Overall ADL's : Needs assistance/impaired Eating/Feeding: Set up;Sitting   Grooming: Set up;Sitting   Upper Body Bathing: Min guard;Sitting (unsupported)   Lower Body Bathing: Maximal assistance;Sit to/from stand;Cueing for safety   Upper Body Dressing : Min guard;Sitting (unsupported)   Lower Body Dressing: Maximal assistance;Sit to/from stand;Cueing for safety   Toilet Transfer: Moderate assistance;RW;BSC   Toileting- Clothing Manipulation and Hygiene: Moderate assistance;Sit to/from stand;Cueing for safety         General ADL Comments: Patient limited by decreased strength/ROM and overall decreased endurance. Patient performed sit<>stand and functional ambulation with mod assist, provided by therapist. Patient required min guard for dynamic sitting balance EOB, unsupported.     Pertinent Vitals/Pain Pain Assessment: No/denies pain ("I don't have much of a pain")     Hand Dominance Right   Extremity/Trunk Assessment Upper Extremity Assessment Upper Extremity Assessment: Generalized weakness   Lower Extremity Assessment Lower Extremity Assessment: Defer to PT evaluation       Communication Communication Communication: No difficulties   Cognition Arousal/Alertness: Awake/alert Behavior During Therapy: WFL for tasks assessed/performed Overall Cognitive Status: Within Functional Limits for tasks assessed              Home Living Family/patient expects to be discharged to:: Private residence Living Arrangements: Children (son) Available Help at Discharge: Family;Available PRN/intermittently   Home Access: Ramped entrance     Home Layout: Two level;Able to live on main level with bedroom/bathroom     Bathroom Shower/Tub: Walk-in shower;Door   [employer] Toilet: Standard     Home Equipment: Environmental consultant - 2 wheels;Wheelchair - manual;Cane - single point;Bedside commode;Toilet riser   Additional Comments: Pt  with life alert  Prior Functioning/Environment Level of Independence: Independent     OT Diagnosis: Generalized weakness;Acute pain   OT Problem List: Decreased strength;Decreased range of motion;Decreased activity tolerance;Impaired balance (sitting and/or standing);Decreased coordination;Decreased safety awareness;Decreased knowledge of use of DME or AE;Decreased knowledge of precautions;Pain   OT Treatment/Interventions: Self-care/ADL training;Therapeutic exercise;Energy conservation;DME and/or AE instruction;Therapeutic activities;Patient/family education;Balance training    OT Goals(Current goals can be found in the care plan section) Acute Rehab OT Goals Patient Stated Goal: get stronger OT Goal Formulation: With patient Time For Goal Achievement: 07/10/14 Potential to Achieve Goals: Good ADL Goals Pt Will Perform Grooming: with min guard assist;standing Pt Will Perform Upper Body Bathing: with set-up;sitting Pt Will Perform Lower Body Bathing: sit to/from stand;with adaptive equipment;with min assist Pt Will Perform Upper Body Dressing: with set-up;sitting Pt Will Perform Lower Body Dressing: with min assist;with adaptive equipment;sit to/from stand Pt Will Transfer to Toilet: with min assist;bedside commode;ambulating Pt Will Perform Toileting - Clothing Manipulation and hygiene: with supervision;sit to/from stand Pt/caregiver will Perform Home Exercise Program: Increased strength;Both right and left upper extremity;With Supervision;With written HEP provided  OT Frequency: Min 2X/week   Barriers to D/C: None known at this time   End of Session Equipment Utilized During Treatment: Gait belt;Rolling walker  Activity Tolerance: Patient tolerated treatment well Patient left: in chair;with call bell/phone within reach   Time: 5643-3295 OT Time Calculation (min): 29 min Charges:  OT General Charges $OT Visit: 1 Procedure OT Evaluation $Initial OT Evaluation Tier I: 1  Procedure OT Treatments $Self Care/Home Management : 8-22 mins  Ryley Teater , MS, OTR/L, CLT Pager: [phone]  07/03/2014, 11:23 AM

## 2014-07-04 LAB — CBC
HCT: 33.7 % — ABNORMAL LOW (ref 36.0–46.0)
Hemoglobin: 10.3 g/dL — ABNORMAL LOW (ref 12.0–15.0)
MCH: 31.3 pg (ref 26.0–34.0)
MCHC: 30.6 g/dL (ref 30.0–36.0)
MCV: 102.4 fL — ABNORMAL HIGH (ref 78.0–100.0)
PLATELETS: 254 10*3/uL (ref 150–400)
RBC: 3.29 MIL/uL — AB (ref 3.87–5.11)
RDW: 17 % — ABNORMAL HIGH (ref 11.5–15.5)
WBC: 14.4 10*3/uL — ABNORMAL HIGH (ref 4.0–10.5)

## 2014-07-04 LAB — GLUCOSE, CAPILLARY
GLUCOSE-CAPILLARY: 70 mg/dL (ref 70–99)
GLUCOSE-CAPILLARY: 95 mg/dL (ref 70–99)
GLUCOSE-CAPILLARY: 121 mg/dL — AB (ref 70–99)
Glucose-Capillary: 114 mg/dL — ABNORMAL HIGH (ref 70–99)

## 2014-07-04 LAB — URINE CULTURE

## 2014-07-04 MED ORDER — ALUM & MAG HYDROXIDE-SIMETH 200-200-20 MG/5ML PO SUSP
30.0000 mL | Freq: Four times a day (QID) | ORAL | Status: DC | PRN
  Administered 2014-07-04: 30 mL via ORAL
  Filled 2014-07-04: qty 30

## 2014-07-04 MED ORDER — RIVAROXABAN 10 MG PO TABS
10.0000 mg | ORAL_TABLET | Freq: Every day | ORAL | Status: DC
  Administered 2014-07-04: 10 mg via ORAL
  Filled 2014-07-04: qty 1

## 2014-07-04 NOTE — Clinical Social Work Note (Addendum)
Patient will discharge to [facility] once medically cleared.  MD is aware that SNF is ready.  Projected dc: Wednesday.  SNF aware.  Nonnie Done, LCSW [phone]  Psychiatric & Orthopedics (5N 1-8) Clinical Social Worker

## 2014-07-04 NOTE — Progress Notes (Signed)
     Subjective:  POD#2 L hip hemiarthroplasty. Patient reports pain as mild to moderate.  Resting comfortably in bed.   Objective:   VITALS:   Filed Vitals:   07/03/14 2030 07/04/14 0000 07/04/14 0355 07/04/14 0451  BP: 122/46   128/45  Pulse: 68   72  Temp: 98.9 F (37.2 C)   98.8 F (37.1 C)  TempSrc:      Resp: 16 16 16 16   Height:      Weight:      SpO2: 92% 94% 94% 93%    Neurologically intact ABD soft Neurovascular intact Sensation intact distally Intact pulses distally Dorsiflexion/Plantar flexion intact Incision: dressing C/D/I   Lab Results  Component Value Date   WBC 23.2* 07/03/2014   HGB 10.3* 07/03/2014   HCT 32.8* 07/03/2014   MCV 101.5* 07/03/2014   PLT 253 07/03/2014   BMET    Component Value Date/Time   NA 139 07/02/2014 0510   K 4.5 07/02/2014 0510   CL 103 07/02/2014 0510   CO2 27 07/02/2014 0510   GLUCOSE 213* 07/02/2014 0510   BUN 14 07/02/2014 0510   CREATININE 1.11* 07/02/2014 0510   CALCIUM 8.3* 07/02/2014 0510   GFRNONAA 46* 07/02/2014 0510   GFRAA 54* 07/02/2014 0510     Assessment/Plan: 2 Days Post-Op   Active Problems:   Left displaced femoral neck fracture   Essential hypertension   Impaired glucose tolerance   Peripheral neuropathy   Up with therapy WBAT in the RLE Posterior hip precautions Xarelto for DVT prophylaxis at discharge per PCP recommendation due to prior DVT   Sherwood Castilla Marie 07/04/2014, 5:17 AM Cell [phone]

## 2014-07-04 NOTE — Care Management Note (Signed)
CARE MANAGEMENT NOTE 07/04/2014  Patient:  Monica Evans, Monica Evans   Account Number:  [id number]  Date Initiated:  07/03/2014  Documentation initiated by:  Ricki Miller  Subjective/Objective Assessment:   PAtient admitted s/p fall with a left femerol neck fracture, patient underwent a left hip arthroplasty.     Action/Plan:   Patient had stated she wants [facility]. Referral, orders, op notes faxed to [facility]. Fax-[phone]. They will contact patient at discharge. [facility] is backup plan if poor progress with PT.   Anticipated DC Date:  07/05/2014   Anticipated DC Plan:  SKILLED NURSING FACILITY  In-house referral  Clinical Social Worker      DC Forensic scientist  CM consult      [facility] Choice  HOME HEALTH   Choice offered to / List presented to:  C-1 Patient        [place] arranged  HH-2 PT      [place] agency  [facility]   Status of service:  Completed, signed off Medicare Important Message given?  YES (If response is "NO", the following Medicare IM given date fields will be blank) Date Medicare IM given:  07/04/2014 Medicare IM given by:  Ricki Miller Date Additional Medicare IM given:   Additional Medicare IM given by:    Discharge Disposition:  [place]  Per UR Regulation:  Reviewed for med. necessity/level of care/duration of stay  If discussed at [place] of Stay Meetings, dates discussed:

## 2014-07-04 NOTE — Progress Notes (Addendum)
Patient Demographics  Monica Evans, is a 80 y.o. female, DOB - 02-26-1935, YKD:[id number]  Admit date - 07/01/2014   Admitting Physician Orson Eva, MD  Outpatient Primary MD for the patient is Ann Held, MD  LOS - 3   Chief Complaint  Patient presents with  . Fall        Subjective:   [name] today has, No headache, No chest pain, No abdominal pain - No Nausea, No new weakness tingling or numbness, No Cough - SOB.  Mild left-sided hip pain.  Assessment & Plan    1. Mechanical fall with left femoral neck fracture. Status post open reduction internal fixation on 07/02/2014 by Dr. Percell Miller. Tolerated the procedure well. On xaralto for DVT prophylaxis. Mild perioperative blood loss related anemia not requiring transfusion. Weightbearing as tolerated. Will require PT & placement. Likely DC in AM.   2. DM type II. Diet controlled, on ISS here monitor.  CBG (last 3)   Recent Labs  07/03/14 1618 07/03/14 2114 07/04/14 0629  GLUCAP 111* 100* 70     3. Essential hypertension. On combination of verapamil and diuretic which will be continued.   4. History of eosinophilic pneumonia. Received IV steroid night of surgery now on On prednisone at home dose, continue.   5. Peripheral neuropathy. On Neurontin and tramadol continued.   6. Remote history of DVT. Finished her treatment with xaralto. Currently on prophylactic dose xaralto switched from Lovenox.   7. Neck and left shoulder pain. CT neck unremarkable, shoulder has good range of motion and no tenderness except small bruise from the fall. Monitor.   8. Leukocytosis. Nonspecific and improving, could be due to steroid use at home and one dose of IV steroids she received here. Chest x-ray clear, stable UA, she is afebrile we  will monitor closely. Incentive spirometer and flutter valve added.    Code Status: Full  Family Communication: family bedside  Disposition Plan: SNF in am   Procedures   ORIF L Femure 07-02-14 by Alain Marion   Consults  Ortho   Medications  Scheduled Meds: . aspirin EC  325 mg Oral Q breakfast  . calcium-vitamin D  1 tablet Oral BID  . feeding supplement (GLUCERNA SHAKE)  237 mL Oral BID BM  . furosemide  40 mg Oral Daily  . gabapentin  800 mg Oral TID  . insulin aspart  0-9 Units Subcutaneous TID WC  . pantoprazole  40 mg Oral Daily  . potassium chloride  30 mEq Oral BID  . timolol  1 drop Left Eye BID  . verapamil  240 mg Oral QHS   Continuous Infusions:  PRN Meds:.acetaminophen **OR** acetaminophen, fentaNYL (SUBLIMAZE) injection, HYDROcodone-acetaminophen, morphine injection, morphine injection, ondansetron **OR** ondansetron (ZOFRAN) IV, senna-docusate, traMADol  DVT Prophylaxis  Lovenox    Lab Results  Component Value Date   PLT 254 07/04/2014    Antibiotics   Anti-infectives    Start     Dose/Rate Route Frequency Ordered Stop   07/02/14 1400  ceFAZolin (ANCEF) IVPB 2 g/50 mL premix     2 g 100 mL/hr over 30 Minutes Intravenous Every 6 hours 07/02/14 1106 07/02/14 2220   07/02/14 0600  ceFAZolin (ANCEF) IVPB 2 g/50 mL premix  2 g 100 mL/hr over 30 Minutes Intravenous On call to O.R. 07/01/14 1859 07/02/14 0744          Objective:   Filed Vitals:   07/03/14 2030 07/04/14 0000 07/04/14 0355 07/04/14 0451  BP: 122/46   128/45  Pulse: 68   72  Temp: 98.9 F (37.2 C)   98.8 F (37.1 C)  TempSrc:      Resp: 16 16 16 16   Height:      Weight:      SpO2: 92% 94% 94% 93%    Wt Readings from Last 3 Encounters:  07/01/14 80.287 kg (177 lb)  10/03/13 81.103 kg (178 lb 12.8 oz)  08/29/13 79 kg (174 lb 2.6 oz)     Intake/Output Summary (Last 24 hours) at 07/04/14 1051 Last data filed at 07/04/14 0932  Gross per 24 hour  Intake    720 ml    Output    950 ml  Net   -230 ml     Physical Exam  Awake Alert, Oriented X 3, No new F.N deficits, Normal affect [place].AT,PERRAL Supple Neck,No JVD, No cervical lymphadenopathy appriciated.  Symmetrical Chest wall movement, Good air movement bilaterally, CTAB RRR,No Gallops,Rubs or new Murmurs, No Parasternal Heave +ve B.Sounds, Abd Soft, No tenderness, No organomegaly appriciated, No rebound - guarding or rigidity. No Cyanosis, Clubbing or edema, No new Rash or bruise , L shoulder bruise , L hip incision stable    Data Review   Micro Results Recent Results (from the past 240 hour(s))  Surgical pcr screen     Status: Abnormal   Collection Time: 07/01/14  9:48 PM  Result Value Ref Range Status   MRSA, PCR NEGATIVE NEGATIVE Final   Staphylococcus aureus POSITIVE (A) NEGATIVE Final    Comment:        The Xpert SA Assay (FDA approved for NASAL specimens in patients over 60 years of age), is one component of a comprehensive surveillance program.  Test performance has been validated by [facility] for patients greater than or equal to 24 year old. It is not intended to diagnose infection nor to guide or monitor treatment.     Radiology Reports Dg Chest 1 View  07/01/2014   CLINICAL DATA:  Fall today  EXAM: CHEST  1 VIEW  COMPARISON:  09/30/2013  FINDINGS: Cardiac enlargement without heart failure. Atherosclerotic ectatic aorta.  Negative for pneumonia or edema. Small left effusion. No displaced rib fracture is identified. Advanced degenerative change right shoulder joint.  IMPRESSION: Small left pleural effusion.  No rib fracture is identified.   Electronically Signed   By: Franchot Gallo M.D.   On: 07/01/2014 16:48   Ct Head Wo Contrast  07/01/2014   CLINICAL DATA:  [place] and hit back of head earlier today. Head pain. Neck pain.  EXAM: CT HEAD WITHOUT CONTRAST  CT CERVICAL SPINE WITHOUT CONTRAST  TECHNIQUE: Multidetector CT imaging of the head and cervical spine was performed  following the standard protocol without intravenous contrast. Multiplanar CT image reconstructions of the cervical spine were also generated.  COMPARISON:  CT head 01/15/2009.  FINDINGS: CT HEAD FINDINGS  No evidence for acute infarction, hemorrhage, mass lesion, hydrocephalus, or extra-axial fluid. Generalized atrophy not unexpected for age 26. Chronic microvascular ischemic change affects periventricular and subcortical white matter. No calvarial lesions. No skull fracture. Vascular calcification. No sinus or mastoid air fluid level.  CT CERVICAL SPINE FINDINGS  There is no visible cervical spine fracture, traumatic subluxation, prevertebral soft  tissue swelling, or intraspinal hematoma. Ten swan-neck deformity related to degenerative change. Severe disc space narrowing C5-6 and C6-7. Advanced facet arthropathy at multiple levels. No worrisome osseous lesion. Atherosclerosis with kissing carotids in the nasopharyngeal midline. No lung apex lesion. No definite neck masses.  IMPRESSION: No acute intracranial findings. No skull fracture or intracranial hemorrhage  No cervical spine fracture or traumatic subluxation.   Electronically Signed   By: Rolla Flatten M.D.   On: 07/01/2014 17:13   Ct Cervical Spine Wo Contrast  07/01/2014   CLINICAL DATA:  [place] and hit back of head earlier today. Head pain. Neck pain.  EXAM: CT HEAD WITHOUT CONTRAST  CT CERVICAL SPINE WITHOUT CONTRAST  TECHNIQUE: Multidetector CT imaging of the head and cervical spine was performed following the standard protocol without intravenous contrast. Multiplanar CT image reconstructions of the cervical spine were also generated.  COMPARISON:  CT head 01/15/2009.  FINDINGS: CT HEAD FINDINGS  No evidence for acute infarction, hemorrhage, mass lesion, hydrocephalus, or extra-axial fluid. Generalized atrophy not unexpected for age 23. Chronic microvascular ischemic change affects periventricular and subcortical white matter. No calvarial lesions. No  skull fracture. Vascular calcification. No sinus or mastoid air fluid level.  CT CERVICAL SPINE FINDINGS  There is no visible cervical spine fracture, traumatic subluxation, prevertebral soft tissue swelling, or intraspinal hematoma. Ten swan-neck deformity related to degenerative change. Severe disc space narrowing C5-6 and C6-7. Advanced facet arthropathy at multiple levels. No worrisome osseous lesion. Atherosclerosis with kissing carotids in the nasopharyngeal midline. No lung apex lesion. No definite neck masses.  IMPRESSION: No acute intracranial findings. No skull fracture or intracranial hemorrhage  No cervical spine fracture or traumatic subluxation.   Electronically Signed   By: Rolla Flatten M.D.   On: 07/01/2014 17:13   Pelvis Portable  07/02/2014   CLINICAL DATA:  Bipolar LEFT hip hemiarthroplasty placement.  EXAM: PORTABLE PELVIS 1-2 VIEWS  COMPARISON:  None.  FINDINGS: Single frontal view of the pelvis demonstrates a new LEFT bipolar hip hemiarthroplasty. Old RIGHT obturator ring fractures are present. Atherosclerosis. Lumbar PLIF. No complicating features. Distal femoral stem visible and normal. No lateral view is submitted. Expected postsurgical changes in soft tissues.  IMPRESSION: Uncomplicated new LEFT bipolar hip hemiarthroplasty.   Electronically Signed   By: Dereck Ligas M.D.   On: 07/02/2014 11:33   Dg Chest Port 1 View  07/03/2014   CLINICAL DATA:  Shortness of breath. Status post left hip replacement 07/02/2014.  EXAM: PORTABLE CHEST - 1 VIEW  COMPARISON:  Single view of the chest 07/01/2014.  FINDINGS: There is cardiomegaly without edema. The lungs are clear. No pneumothorax or pleural effusion. Severe degenerative disease right shoulder is noted.  IMPRESSION: Cardiomegaly without acute disease.   Electronically Signed   By: Inge Rise M.D.   On: 07/03/2014 08:08   Dg Hip Unilat With Pelvis 2-3 Views Left  07/01/2014   CLINICAL DATA:  Fall.  EXAM: LEFT HIP (WITH PELVIS)  2-3 VIEWS  COMPARISON:  None.  FINDINGS: Left femoral neck fracture with foreshortening and angulation. Left hip joint is normal.  Chronic healed fractures of the right superior and inferior pubic rami. Right hip joint is normal. Lumbar fusion hardware noted.  IMPRESSION: Angulated fracture left femoral neck.   Electronically Signed   By: Franchot Gallo M.D.   On: 07/01/2014 16:49     CBC  Recent Labs Lab 07/01/14 1550 07/02/14 0510 07/03/14 0501 07/04/14 0700  WBC 13.6* 14.6* 23.2* 14.4*  HGB 12.0  12.0 10.3* 10.3*  HCT 39.1 38.7 32.8* 33.7*  PLT 302 302 253 254  MCV 100.5* 101.0* 101.5* 102.4*  MCH 30.8 31.3 31.9 31.3  MCHC 30.7 31.0 31.4 30.6  RDW 16.1* 16.3* 16.2* 17.0*  LYMPHSABS 0.8  --   --   --   MONOABS 0.9  --   --   --   EOSABS 0.3  --   --   --   BASOSABS 0.1  --   --   --     Chemistries   Recent Labs Lab 07/01/14 1550 07/02/14 0510  NA 138 139  K 4.1 4.5  CL 100 103  CO2 30 27  GLUCOSE 158* 213*  BUN 17 14  CREATININE 1.07 1.11*  CALCIUM 8.9 8.3*   ------------------------------------------------------------------------------------------------------------------ estimated creatinine clearance is 39.2 mL/min (by C-G formula based on Cr of 1.11). ------------------------------------------------------------------------------------------------------------------  Recent Labs  07/01/14 2024  HGBA1C 6.6*   ------------------------------------------------------------------------------------------------------------------ No results for input(s): CHOL, HDL, LDLCALC, TRIG, CHOLHDL, LDLDIRECT in the last 72 hours. ------------------------------------------------------------------------------------------------------------------ No results for input(s): TSH, T4TOTAL, T3FREE, THYROIDAB in the last 72 hours.  Invalid input(s): FREET3 ------------------------------------------------------------------------------------------------------------------ No results for  input(s): VITAMINB12, FOLATE, FERRITIN, TIBC, IRON, RETICCTPCT in the last 72 hours.  Coagulation profile  Recent Labs Lab 07/01/14 1550  INR 1.05    No results for input(s): DDIMER in the last 72 hours.  Cardiac Enzymes No results for input(s): CKMB, TROPONINI, MYOGLOBIN in the last 168 hours.  Invalid input(s): CK ------------------------------------------------------------------------------------------------------------------ Invalid input(s): POCBNP   Time Spent in minutes   35   Kaleeya Hancock K M.D on 07/04/2014 at 10:51 AM  Between 7am to 7pm - Pager - [phone]  After 7pm go to [email or web address] - password [facility]  [facility]   Office  [phone]

## 2014-07-04 NOTE — Progress Notes (Signed)
ANTICOAGULATION CONSULT NOTE - Initial Consult  Pharmacy Consult for xarelto Indication: VTE prophylaxis  Allergies  Allergen Reactions  . Penicillins Swelling    Tolerated Ancef 08/29/13, tdd    Patient Measurements: Height: 5' (152.4 cm) Weight: 177 lb (80.287 kg) IBW/kg (Calculated) : 45.5   Vital Signs: Temp: 98.8 F (37.1 C) (04/26 0451) BP: 128/45 mmHg (04/26 0451) Pulse Rate: 72 (04/26 0451)  Labs:  Recent Labs  07/01/14 1550 07/02/14 0510 07/03/14 0501 07/04/14 0700  HGB 12.0 12.0 10.3* 10.3*  HCT 39.1 38.7 32.8* 33.7*  PLT 302 302 253 254  LABPROT 13.8  --   --   --   INR 1.05  --   --   --   CREATININE 1.07 1.11*  --   --     Estimated Creatinine Clearance: 39.2 mL/min (by C-G formula based on Cr of 1.11).   Medical History: Past Medical History  Diagnosis Date  . Diabetes mellitus without complication     prediabetes- HgbA1C- has been monitored  by PCP- Dr. Nicki Reaper- [facility]   . Complication of anesthesia   . PONV (postoperative nausea and vomiting)     nausea  . Hypertension   . Pneumonia     hosp. many yrs. ago for pneumonia  . GERD (gastroesophageal reflux disease)   . Arthritis     + lumbar stenosis  . Blood dyscrasia     rare blood disease - since she was 39 yrs. old that has lead to chest/lung scarring .  Marland Kitchen Apical lung scarring     lung scarring related to disease that was diagnosed when she was 80 y.o.   . 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    . Glaucoma     left eye only  . DVT (deep venous thrombosis) 10/03/2013    LEFT LEG    Medications:  Prescriptions prior to admission  Medication Sig Dispense Refill Last Dose  . aspirin 81 MG tablet Take 81 mg by mouth at bedtime.   07/01/2014 at Unknown time  . Calcium Carbonate-Vitamin D (CALCIUM-VITAMIN D) 500-200 MG-UNIT per tablet Take 1 tablet by mouth 2 (two) times daily.   07/01/2014 at Unknown time  . co-enzyme Q-10 30 MG capsule  Take 30 mg by mouth daily.   07/01/2014 at Unknown time  . furosemide (LASIX) 40 MG tablet Take 80 mg by mouth every evening.   07/01/2014 at Unknown time  . gabapentin (NEURONTIN) 600 MG tablet Take 600 mg by mouth 2 (two) times daily.  0 07/01/2014 at Unknown time  . nystatin cream (MYCOSTATIN) Apply 1 application topically daily as needed for dry skin.   12 07/01/2014 at Unknown time  . omeprazole (PRILOSEC) 20 MG capsule Take 20 mg by mouth daily.    07/01/2014 at Unknown time  . potassium chloride (MICRO-K) 10 MEQ CR capsule Take 60 mEq by mouth daily.    07/01/2014 at Unknown time  . predniSONE (DELTASONE) 10 MG tablet Take 10 mg by mouth daily with breakfast.   07/01/2014 at Unknown time  . timolol (TIMOPTIC) 0.5 % ophthalmic solution Place 1 drop into the left eye 2 (two) times daily.   3 07/01/2014 at Unknown time  . traMADol (ULTRAM) 50 MG tablet Take 50 mg by mouth See admin instructions. Take three to four times daily per patient   07/01/2014 at Unknown time  . verapamil (CALAN-SR) 240 MG CR tablet Take 240 mg by  mouth at bedtime.   06/30/2014 at Unknown time  . ACCU-CHEK FASTCLIX LANCETS MISC See admin instructions.  12   . ACCU-CHEK SMARTVIEW test strip   3   . clindamycin (CLEOCIN) 150 MG capsule Take 1 capsule (150 mg total) by mouth 3 (three) times daily. (Patient not taking: Reported on 07/01/2014) 30 capsule 0 Not Taking at Unknown time  . silver sulfADIAZINE (SILVADENE) 1 % cream Apply 1 application topically daily. (Patient not taking: Reported on 07/01/2014) 50 g 0 Not Taking at Unknown time    Assessment: 80 yo lady s/p hip surgery to start xarelto for VTE px.  She was previously on lovenox with last dose last pm.  Her Hg is low but stable.  PTLC 254 Goal of Therapy:  Prevention of VTE Monitor platelets by anticoagulation protocol: Yes   Plan:  Xarelto 10 mg po daily Consider reduction of ASA to 81 mg daily while on xarelto Monitor for bleeding complications  Excell Seltzer  [place] 07/04/2014,12:03 PM

## 2014-07-04 NOTE — Progress Notes (Signed)
Physical Therapy Treatment Patient Details Name: Monica Evans MRN: [id number] DOB: 09-May-1934 Today's Date: 07/04/2014    History of Present Illness Pt is a 80 y/o F s/p fall, L femoral neck fx now s/p ORIF on 07/02/14.  Pt's PMH includes DM, PONV, HTN, blood dyscrasia, glaucoma, and DVT in 2015.     PT Comments    Attempted ambulation this AM, but pt unable due to pain. Pt able to do sit<>stand x2 and agreeable to exercises, but unable to advance RLE for ambulation. She would benefit from continued PT to increase functional independence and safety. Continue to recommend SNF for ongoing PT.  Follow Up Recommendations  SNF;Supervision for mobility/OOB     Equipment Recommendations  None recommended by PT    Recommendations for Other Services       Precautions / Restrictions Precautions Precautions: Posterior Hip;Fall Precaution Comments: Reviewed post hip precautions. Pt able to recall 2/3 precautions. Cues to recall hip rotation precaution. Restrictions Weight Bearing Restrictions: Yes LLE Weight Bearing: Weight bearing as tolerated    Mobility  Bed Mobility               General bed mobility comments: Pt in chair before and after.  Transfers Overall transfer level: Needs assistance Equipment used: Rolling walker (2 wheeled) Transfers: Sit to/from Stand Sit to Stand: Min assist         General transfer comment: Min A to stand upright. Cues for hand placement.  Ambulation/Gait             General Gait Details: Attempted ambulation, but pt unable due to pain.   Stairs            Wheelchair Mobility    Modified Rankin (Stroke Patients Only)       Balance                                    Cognition Arousal/Alertness: Awake/alert Behavior During Therapy: WFL for tasks assessed/performed Overall Cognitive Status: Within Functional Limits for tasks assessed                      Exercises Total Joint  Exercises Ankle Circles/Pumps: AROM;Both;10 reps Quad Sets: AROM;Left;10 reps Heel Slides: AAROM;Left;5 reps Hip ABduction/ADduction: AAROM;Left;5 reps Long Arc Quad: AROM;Left;10 reps    General Comments        Pertinent Vitals/Pain Pain Assessment: 0-10 Pain Score: 9  Pain Location: L hip Pain Descriptors / Indicators: Constant;Sore;Grimacing Pain Intervention(s): Limited activity within patient's tolerance;Monitored during session;RN gave pain meds during session    Home Living                      Prior Function            PT Goals (current goals can now be found in the care plan section) Progress towards PT goals: Progressing toward goals    Frequency  Min 3X/week    PT Plan Current plan remains appropriate    Co-evaluation             End of Session Equipment Utilized During Treatment: Gait belt Activity Tolerance: Patient limited by pain Patient left: in chair;with call bell/phone within reach     Time: 1044-1109 PT Time Calculation (min) (ACUTE ONLY): 25 min  Charges:  G CodesRubye Oaks, [place] 07/04/2014, 11:22 AM

## 2014-07-05 DIAGNOSIS — M810 Age-related osteoporosis without current pathological fracture: Secondary | ICD-10-CM | POA: Diagnosis not present

## 2014-07-05 DIAGNOSIS — I10 Essential (primary) hypertension: Secondary | ICD-10-CM | POA: Diagnosis not present

## 2014-07-05 DIAGNOSIS — D649 Anemia, unspecified: Secondary | ICD-10-CM | POA: Diagnosis not present

## 2014-07-05 DIAGNOSIS — R7302 Impaired glucose tolerance (oral): Secondary | ICD-10-CM | POA: Diagnosis not present

## 2014-07-05 DIAGNOSIS — W010XXD Fall on same level from slipping, tripping and stumbling without subsequent striking against object, subsequent encounter: Secondary | ICD-10-CM | POA: Diagnosis not present

## 2014-07-05 DIAGNOSIS — M21252 Flexion deformity, left hip: Secondary | ICD-10-CM | POA: Diagnosis not present

## 2014-07-05 DIAGNOSIS — G629 Polyneuropathy, unspecified: Secondary | ICD-10-CM | POA: Diagnosis not present

## 2014-07-05 DIAGNOSIS — S72002D Fracture of unspecified part of neck of left femur, subsequent encounter for closed fracture with routine healing: Secondary | ICD-10-CM | POA: Diagnosis not present

## 2014-07-05 DIAGNOSIS — I119 Hypertensive heart disease without heart failure: Secondary | ICD-10-CM | POA: Diagnosis not present

## 2014-07-05 LAB — GLUCOSE, CAPILLARY
Glucose-Capillary: 98 mg/dL (ref 70–99)
Glucose-Capillary: 117 mg/dL — ABNORMAL HIGH (ref 70–99)

## 2014-07-05 MED ORDER — ASPIRIN 81 MG PO CHEW
81.0000 mg | CHEWABLE_TABLET | Freq: Every day | ORAL | Status: DC
  Administered 2014-07-05: 81 mg via ORAL
  Filled 2014-07-05: qty 1

## 2014-07-05 NOTE — Discharge Instructions (Signed)
Follow with Primary MD Ann Held, MD in 7 days   Get CBC, CMP, 2 view Chest X ray checked  by Primary MD next visit.    Activity: Weightbearing as tolerated tolerated with Full fall precautions use walker/cane & assistance as needed, see orthopedic instructions in detail below.   Disposition SNF   Diet: Heart Healthy Low Carb  For Heart failure patients - Check your Weight same time everyday, if you gain over 2 pounds, or you develop in leg swelling, experience more shortness of breath or chest pain, call your Primary MD immediately. Follow Cardiac Low Salt Diet and 1.5 lit/day fluid restriction.   On your next visit with your primary care physician please Get Medicines reviewed and adjusted.   Please request your Prim.MD to go over all Hospital Tests and Procedure/Radiological results at the follow up, please get all Hospital records sent to your Prim MD by signing hospital release before you go home.   If you experience worsening of your admission symptoms, develop shortness of breath, life threatening emergency, suicidal or homicidal thoughts you must seek medical attention immediately by calling 911 or calling your MD immediately  if symptoms less severe.  You Must read complete instructions/literature along with all the possible adverse reactions/side effects for all the Medicines you take and that have been prescribed to you. Take any new Medicines after you have completely understood and accpet all the possible adverse reactions/side effects.   Do not drive, operating heavy machinery, perform activities at heights, swimming or participation in water activities or provide baby sitting services if your were admitted for syncope or siezures until you have seen by Primary MD or a Neurologist and advised to do so again.  Do not drive when taking Pain medications.    Do not take more than prescribed Pain, Sleep and Anxiety Medications  Special Instructions: If you have smoked or  chewed Tobacco  in the last 2 yrs please stop smoking, stop any regular Alcohol  and or any Recreational drug use.  Wear Seat belts while driving.   Please note  You were cared for by a hospitalist during your hospital stay. If you have any questions about your discharge medications or the care you received while you were in the hospital after you are discharged, you can call the unit and asked to speak with the hospitalist on call if the hospitalist that took care of you is not available. Once you are discharged, your primary care physician will handle any further medical issues. Please note that NO REFILLS for any discharge medications will be authorized once you are discharged, as it is imperative that you return to your primary care physician (or establish a relationship with a primary care physician if you do not have one) for your aftercare needs so that they can reassess your need for medications and monitor your lab values.    INSTRUCTIONS  o Remove items at home which could result in a fall. This includes throw rugs or furniture in walking pathways o ICE to the affected joint every three hours while awake for 30 minutes at a time, for at least the first 3-5 days, and then as needed for pain and swelling.  Continue to use ice for pain and swelling. You may notice swelling that will progress down to the foot and ankle.  This is normal after surgery.  Elevate your leg when you are not up walking on it.   o Continue to use the breathing machine you  got in the hospital (incentive spirometer) which will help keep your temperature down.  It is common for your temperature to cycle up and down following surgery, especially at night when you are not up moving around and exerting yourself.  The breathing machine keeps your lungs expanded and your temperature down.   DIET:  As you were doing prior to hospitalization, we recommend a well-balanced diet.  DRESSING / WOUND CARE / SHOWERING  Keep the  surgical dressing until follow up.  IF THE DRESSING FALLS OFF or the wound gets wet inside, change the dressing with sterile gauze.  Please use good hand washing techniques before changing the dressing.  Do not use any lotions or creams on the incision until instructed by your surgeon.    ACTIVITY  o Increase activity slowly as tolerated, but follow the weight bearing instructions below.   o No driving for 6 weeks or until further direction given by your physician.  You cannot drive while taking narcotics.  o No lifting or carrying greater than 10 lbs. until further directed by your surgeon. o Avoid periods of inactivity such as sitting longer than an hour when not asleep. This helps prevent blood clots.  o You may return to work once you are authorized by your doctor.     WEIGHT BEARING   Weight bearing as tolerated with assist device (walker, cane, etc) as directed, use it as long as suggested by your surgeon or therapist, typically at least 4-6 weeks.   CONSTIPATION  Constipation is defined medically as fewer than three stools per week and severe constipation as less than one stool per week.  Even if you have a regular bowel pattern at home, your normal regimen is likely to be disrupted due to multiple reasons following surgery.  Combination of anesthesia, postoperative narcotics, change in appetite and fluid intake all can affect your bowels.   YOU MUST use at least one of the following options; they are listed in order of increasing strength to get the job done.  They are all available over the counter, and you may need to use some, POSSIBLY even all of these options:    Drink plenty of fluids (prune juice may be helpful) and high fiber foods Colace 100 mg by mouth twice a day  Senokot for constipation as directed and as needed Dulcolax (bisacodyl), take with full glass of water  Miralax (polyethylene glycol) once or twice a day as needed.  If you have tried all these things and are  unable to have a bowel movement in the first 3-4 days after surgery call either your surgeon or your primary doctor.    If you experience loose stools or diarrhea, hold the medications until you stool forms back up.  If your symptoms do not get better within 1 week or if they get worse, check with your doctor.  If you experience "the worst abdominal pain ever" or develop nausea or vomiting, please contact the office immediately for further recommendations for treatment.   ITCHING:  If you experience itching with your medications, try taking only a single pain pill, or even half a pain pill at a time.  You can also use Benadryl over the counter for itching or also to help with sleep.   TED HOSE STOCKINGS:  Use stockings on both legs until for at least 2 weeks or as directed by physician office. They may be removed at night for sleeping.  MEDICATIONS:  See your medication summary on the  After Visit Summary that nursing will review with you.  You may have some home medications which will be placed on hold until you complete the course of blood thinner medication.  It is important for you to complete the blood thinner medication as prescribed.  PRECAUTIONS:  If you experience chest pain or shortness of breath - call 911 immediately for transfer to the hospital emergency department.   If you develop a fever greater that 101 F, purulent drainage from wound, increased redness or drainage from wound, foul odor from the wound/dressing, or calf pain - CONTACT YOUR SURGEON.                                                   FOLLOW-UP APPOINTMENTS:  If you do not already have a post-op appointment, please call the office for an appointment to be seen by your surgeon.  Guidelines for how soon to be seen are listed in your After Visit Summary, but are typically between 1-4 weeks after surgery.  MAKE SURE YOU:   Understand these instructions.   Get help right away if you are not doing well or get worse.     Thank you for letting us be a part of your medical care team.  It is a privilege we respect greatly.  We hope these instructions will help you stay on track for a fast and full recovery!   Information on my medicine - XARELTO (Rivaroxaban)  This medication education was reviewed with me or my healthcare representative as part of my discharge preparation.  The pharmacist that spoke with me during my hospital stay was:  Lavenia Atlas, [facility]  Why was Xarelto prescribed for you? Xarelto was prescribed for you to reduce the risk of blood clots forming after orthopedic surgery. The medical term for these abnormal blood clots is venous thromboembolism (VTE).  What do you need to know about xarelto ? Take your Xarelto ONCE DAILY at the same time every day. You may take it either with or without food.  If you have difficulty swallowing the tablet whole, you may crush it and mix in applesauce just prior to taking your dose.  Take Xarelto exactly as prescribed by your doctor and DO NOT stop taking Xarelto without talking to the doctor who prescribed the medication.  Stopping without other VTE prevention medication to take the place of Xarelto may increase your risk of developing a clot.  After discharge, you should have regular check-up appointments with your healthcare provider that is prescribing your Xarelto.    What do you do if you miss a dose? If you miss a dose, take it as soon as you remember on the same day then continue your regularly scheduled once daily regimen the next day. Do not take two doses of Xarelto on the same day.   Important Safety Information A possible side effect of Xarelto is bleeding. You should call your healthcare provider right away if you experience any of the following: ? Bleeding from an injury or your nose that does not stop. ? Unusual colored urine (red or dark brown) or unusual colored stools (red or black). ? Unusual bruising for unknown  reasons. ? A serious fall or if you hit your head (even if there is no bleeding).  Some medicines may interact with Xarelto and might increase your risk  of bleeding while on Xarelto. To help avoid this, consult your healthcare provider or pharmacist prior to using any new prescription or non-prescription medications, including herbals, vitamins, non-steroidal anti-inflammatory drugs (NSAIDs) and supplements.  This website has more information on Xarelto: [email or web address]

## 2014-07-05 NOTE — Progress Notes (Signed)
Report called to April, RN at [facility] VSS, 50 mg Tramadol given to pt before leaving.

## 2014-07-05 NOTE — Discharge Planning (Signed)
Patient to be discharged to [facility]. Patient updated at bedside.   Facility: [facility] Report number: [phone] Transportation: EMS ([place])  Lubertha Sayres, [place] Cell: [phone]       Fax: [phone] Clinical Social Work: Orthopedics [phone]) and Surgical [phone])

## 2014-07-05 NOTE — Progress Notes (Addendum)
     Subjective:  POD#3 L hip hemiarthroplasty. Patient reports pain as mild to moderate.  Patient resting comfortably in bed. Pt was unable to walk with PT yesterday due to pain, but the patient was able to get up to a chair.   Objective:   VITALS:   Filed Vitals:   07/04/14 2110 07/05/14 0000 07/05/14 0400 07/05/14 0530  BP: 114/45   123/46  Pulse: 90   64  Temp: 98.4 F (36.9 C)   98.5 F (36.9 C)  TempSrc:      Resp: 18 18 18 18   Height:      Weight:      SpO2: 90% 95% 95% 90%    Neurologically intact ABD soft Neurovascular intact Sensation intact distally Intact pulses distally Dorsiflexion/Plantar flexion intact Incision: dressing C/D/I   Lab Results  Component Value Date   WBC 14.4* 07/04/2014   HGB 10.3* 07/04/2014   HCT 33.7* 07/04/2014   MCV 102.4* 07/04/2014   PLT 254 07/04/2014   BMET    Component Value Date/Time   NA 139 07/02/2014 0510   K 4.5 07/02/2014 0510   CL 103 07/02/2014 0510   CO2 27 07/02/2014 0510   GLUCOSE 213* 07/02/2014 0510   BUN 14 07/02/2014 0510   CREATININE 1.11* 07/02/2014 0510   CALCIUM 8.3* 07/02/2014 0510   GFRNONAA 46* 07/02/2014 0510   GFRAA 54* 07/02/2014 0510     Assessment/Plan: 3 Days Post-Op   Active Problems:   Left displaced femoral neck fracture   Essential hypertension   Impaired glucose tolerance   Peripheral neuropathy   Up with therapy WBAT in the LLE Posterior hip precautions Xarelto for DVT prophylaxis, changed the ASA 325mg  to ASA 81mg  since started on Xarelto today.    Monica Evans Monica Evans 07/05/2014, 7:51 AM Cell [phone]

## 2014-07-05 NOTE — Clinical Social Work Placement (Signed)
   CLINICAL SOCIAL WORK PLACEMENT  NOTE  Date:  07/05/2014  Patient Details  Name: Monica Evans MRN: [id number] Date of Birth: [birth year]  Clinical Social Work is seeking post-discharge placement for this patient at the [place] level of care (*CSW will initial, date and re-position this form in  chart as items are completed):  Yes   Patient/family provided with [facility] Work Department's list of facilities offering this level of care within the geographic area requested by the patient (or if unable, by the patient's family).  Yes   Patient/family informed of their freedom to choose among providers that offer the needed level of care, that participate in Medicare, Medicaid or managed care program needed by the patient, have an available bed and are willing to accept the patient.  Yes   Patient/family informed of [facility]'s ownership interest in [facility] and [facility], as well as of the fact that they are under no obligation to receive care at these facilities.  PASRR submitted to EDS on 07/02/14     PASRR number received on 07/02/14     Existing PASRR number confirmed on       FL2 transmitted to all facilities in geographic area requested by pt/family on 07/02/14     FL2 transmitted to all facilities within larger geographic area on       Patient informed that his/her managed care company has contracts with or will negotiate with certain facilities, including the following:        Yes   Patient/family informed of bed offers received.  Patient chooses bed at [facility]     Physician recommends and patient chooses bed at      Patient to be transferred to [facility] on 07/05/14.  Patient to be transferred to facility by [facility]     Patient family notified on 07/05/14 of transfer.  Name of family member notified:  Patient updated at bedside.     PHYSICIAN       Additional Comment:     _______________________________________________ Caroline Sauger, LCSW 07/05/2014, 10:16 AM [phone]

## 2014-07-05 NOTE — Discharge Summary (Signed)
Monica Evans, is a 80 y.o. female  DOB [birth year]  MRN [id number].  Admission date:  07/01/2014  Admitting Physician  Orson Eva, MD  Discharge Date:  07/05/2014   Primary MD  Ann Held, MD  Recommendations for primary care physician for things to follow:    Repeat CBC, BMP and a 2 view chest x-ray in a week   Admission Diagnosis  Fall [W19.XXXA] Left displaced femoral neck fracture, closed, initial encounter [S72.002A]   Discharge Diagnosis  Fall [W19.XXXA] Left displaced femoral neck fracture, closed, initial encounter [S72.002A]    Active Problems:   Left displaced femoral neck fracture   Essential hypertension   Impaired glucose tolerance   Peripheral neuropathy      Past Medical History  Diagnosis Date  . Diabetes mellitus without complication     prediabetes- HgbA1C- has been monitored  by PCP- Dr. Nicki Reaper- [facility]   . Complication of anesthesia   . PONV (postoperative nausea and vomiting)     nausea  . Hypertension   . Pneumonia     hosp. many yrs. ago for pneumonia  . GERD (gastroesophageal reflux disease)   . Arthritis     + lumbar stenosis  . Blood dyscrasia     rare blood disease - since she was 61 yrs. old that has lead to chest/lung scarring .  Marland Kitchen Apical lung scarring     lung scarring related to disease that was diagnosed when she was 80 y.o.   . 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    . Glaucoma     left eye only  . DVT (deep venous thrombosis) 10/03/2013    LEFT LEG    Past Surgical History  Procedure Laterality Date  . Eye surgery Bilateral     IOL only in R eye  . Tubal ligation    . Posterior fusion lumbar spine  08/29/2013    LEVEL 2   . Hip arthroplasty Left 07/02/2014    Procedure: ARTHROPLASTY BIPOLAR HIP;  Surgeon:  Renette Butters, MD;  Location: [facility];  Service: Orthopedics;  Laterality: Left;       History of present illness and  Hospital Course:     Kindly see H&P for history of present illness and admission details, please review complete Labs, Consult reports and Test reports for all details in brief  HPI  from the history and physical done on the day of admission  80 year old female with a history of impaired glucose tolerance, hypertension, left lower extremity DVT, and eosinophilic pneumonia presented after a mechanical fall as she was walking through her doorway. In addition, it appears that the patient may have tripped on her dog as well. There was no syncope. The patient had excruciating left-sided pain and was unable to get up. As result EMS was activated. The patient did not have any prodromal symptoms of chest discomfort, shortness breath, dizziness. Patient denies fevers, chills, headache, chest pain, dyspnea, nausea, vomiting, diarrhea, abdominal  pain, dysuria, hematuria. At baseline, the patient uses a cane to ambulate. The patient did have some neck pain after her mechanical fall but denies any focal extremity weakness or radicular symptoms. In the emergency department, the patient was afebrile and hemodynamically stable. CT of the cervical spine and brain were unremarkable. X-ray of the pelvis revealed a left femoral neck fracture. Chest x-ray showed a small left pleural effusion, but was otherwise negative. EKG shows sinus rhythm without ST-T wave changes  Hospital Course    1. Mechanical fall with left femoral neck fracture. Status post open reduction internal fixation on 07/02/2014 by Dr. Percell Miller. Tolerated the procedure well. On xaralto for DVT prophylaxis. Mild perioperative blood loss related anemia not requiring transfusion. Weightbearing as tolerated. Will require PT & placement. DC to SNF.   2. DM type II. Diet controlled , monitor   CBG (last 3)   Recent Labs (last 2 labs)       Recent Labs  07/03/14 1618 07/03/14 2114 07/04/14 0629  GLUCAP 111* 100* 70       3. Essential hypertension. On combination of verapamil and diuretic which will be continued.   4. History of eosinophilic pneumonia. Received IV steroid night of surgery now on On prednisone at home dose, continue.   5. Peripheral neuropathy. On Neurontin and tramadol continued.   6. Remote history of DVT. Finished her treatment with xaralto for PE purposes. Currently on prophylactic dose xaralto switched from Lovenox. [name] to be stopped by auto and PCP once she is back to her usual activity level based on her DVT prophylaxis risk.   7. Neck and left shoulder pain. CT neck unremarkable, shoulder has good range of motion and no tenderness except small bruise from the fall. Monitor.   8. Leukocytosis. Nonspecific and improving, could be due to steroid use at home and one dose of IV steroids she received here. Chest x-ray clear, stable UA, she was afebrile and nontoxic appearing leukocytosis trend serially improving. Continue Incentive spirometer and flutter valve, we'll request SNF staff to repeat CBC in a week.         Discharge Condition: Stable   Follow UP  Follow-up Information    Follow up with MURPHY, TIMOTHY D, MD In 1 week.   Specialty:  Orthopedic Surgery   Contact information:   [address] [place] [address] [phone]       Follow up with Ann Held, MD. Schedule an appointment as soon as possible for a visit in 1 week.   Specialty:  Family Medicine   Contact information:   [place] [address] [phone]         Discharge Instructions  and  Discharge Medications      Discharge Instructions    Discharge instructions    Complete by:  As directed   Follow with Primary MD Ann Held, MD in 7 days   Get CBC, CMP, 2 view Chest X ray checked  by Primary MD next visit.    Activity: Weightbearing as  tolerated tolerated with Full fall precautions use walker/cane & assistance as needed, see orthopedic instructions in detail below.   Disposition SNF   Diet: Heart Healthy Low Carb  For Heart failure patients - Check your Weight same time everyday, if you gain over 2 pounds, or you develop in leg swelling, experience more shortness of breath or chest pain, call your Primary MD immediately. Follow Cardiac Low Salt Diet and 1.5 lit/day fluid  restriction.   On your next visit with your primary care physician please Get Medicines reviewed and adjusted.   Please request your Prim.MD to go over all Hospital Tests and Procedure/Radiological results at the follow up, please get all Hospital records sent to your Prim MD by signing hospital release before you go home.   If you experience worsening of your admission symptoms, develop shortness of breath, life threatening emergency, suicidal or homicidal thoughts you must seek medical attention immediately by calling 911 or calling your MD immediately  if symptoms less severe.  You Must read complete instructions/literature along with all the possible adverse reactions/side effects for all the Medicines you take and that have been prescribed to you. Take any new Medicines after you have completely understood and accpet all the possible adverse reactions/side effects.   Do not drive, operating heavy machinery, perform activities at heights, swimming or participation in water activities or provide baby sitting services if your were admitted for syncope or siezures until you have seen by Primary MD or a Neurologist and advised to do so again.  Do not drive when taking Pain medications.    Do not take more than prescribed Pain, Sleep and Anxiety Medications  Special Instructions: If you have smoked or chewed Tobacco  in the last 2 yrs please stop smoking, stop any regular Alcohol  and or any Recreational drug use.  Wear Seat belts while  driving.   Please note  You were cared for by a hospitalist during your hospital stay. If you have any questions about your discharge medications or the care you received while you were in the hospital after you are discharged, you can call the unit and asked to speak with the hospitalist on call if the hospitalist that took care of you is not available. Once you are discharged, your primary care physician will handle any further medical issues. Please note that NO REFILLS for any discharge medications will be authorized once you are discharged, as it is imperative that you return to your primary care physician (or establish a relationship with a primary care physician if you do not have one) for your aftercare needs so that they can reassess your need for medications and monitor your lab values.    INSTRUCTIONS  Remove items at home which could result in a fall. This includes throw rugs or furniture in walking pathways ICE to the affected joint every three hours while awake for 30 minutes at a time, for at least the first 3-5 days, and then as needed for pain and swelling.  Continue to use ice for pain and swelling. You may notice swelling that will progress down to the foot and ankle.  This is normal after surgery.  Elevate your leg when you are not up walking on it.   Continue to use the breathing machine you got in the hospital (incentive spirometer) which will help keep your temperature down.  It is common for your temperature to cycle up and down following surgery, especially at night when you are not up moving around and exerting yourself.  The breathing machine keeps your lungs expanded and your temperature down.   DIET:  As you were doing prior to hospitalization, we recommend a well-balanced diet.  DRESSING / WOUND CARE / SHOWERING  Keep the surgical dressing until follow up.  IF THE DRESSING FALLS OFF or the wound gets wet inside, change the dressing with sterile gauze.  Please use  good hand washing techniques before changing  the dressing.  Do not use any lotions or creams on the incision until instructed by your surgeon.    ACTIVITY  Increase activity slowly as tolerated, but follow the weight bearing instructions below.   No driving for 6 weeks or until further direction given by your physician.  You cannot drive while taking narcotics.  No lifting or carrying greater than 10 lbs. until further directed by your surgeon. Avoid periods of inactivity such as sitting longer than an hour when not asleep. This helps prevent blood clots.  You may return to work once you are authorized by your doctor.     WEIGHT BEARING   Weight bearing as tolerated with assist device (walker, cane, etc) as directed, use it as long as suggested by your surgeon or therapist, typically at least 4-6 weeks.   CONSTIPATION  Constipation is defined medically as fewer than three stools per week and severe constipation as less than one stool per week.  Even if you have a regular bowel pattern at home, your normal regimen is likely to be disrupted due to multiple reasons following surgery.  Combination of anesthesia, postoperative narcotics, change in appetite and fluid intake all can affect your bowels.   YOU MUST use at least one of the following options; they are listed in order of increasing strength to get the job done.  They are all available over the counter, and you may need to use some, POSSIBLY even all of these options:    Drink plenty of fluids (prune juice may be helpful) and high fiber foods Colace 100 mg by mouth twice a day  Senokot for constipation as directed and as needed Dulcolax (bisacodyl), take with full glass of water  Miralax (polyethylene glycol) once or twice a day as needed.  If you have tried all these things and are unable to have a bowel movement in the first 3-4 days after surgery call either your surgeon or your primary doctor.    If you experience loose stools  or diarrhea, hold the medications until you stool forms back up.  If your symptoms do not get better within 1 week or if they get worse, check with your doctor.  If you experience "the worst abdominal pain ever" or develop nausea or vomiting, please contact the office immediately for further recommendations for treatment.   ITCHING:  If you experience itching with your medications, try taking only a single pain pill, or even half a pain pill at a time.  You can also use Benadryl over the counter for itching or also to help with sleep.   TED HOSE STOCKINGS:  Use stockings on both legs until for at least 2 weeks or as directed by physician office. They may be removed at night for sleeping.  MEDICATIONS:  See your medication summary on the "After Visit Summary" that nursing will review with you.  You may have some home medications which will be placed on hold until you complete the course of blood thinner medication.  It is important for you to complete the blood thinner medication as prescribed.  PRECAUTIONS:  If you experience chest pain or shortness of breath - call 911 immediately for transfer to the hospital emergency department.   If you develop a fever greater that 101 F, purulent drainage from wound, increased redness or drainage from wound, foul odor from the wound/dressing, or calf pain - CONTACT YOUR SURGEON.  FOLLOW-UP APPOINTMENTS:  If you do not already have a post-op appointment, please call the office for an appointment to be seen by your surgeon.  Guidelines for how soon to be seen are listed in your "After Visit Summary", but are typically between 1-4 weeks after surgery.  MAKE SURE YOU:  Understand these instructions.  Get help right away if you are not doing well or get worse.    Thank you for letting us be a part of your medical care team.  It is a privilege we respect greatly.  We hope these instructions will help you stay on  track for a fast and full recovery!     Increase activity slowly    Complete by:  As directed      Posterior total hip precautions    Complete by:  As directed      Weight bearing as tolerated    Complete by:  As directed   Laterality:  left  Extremity:  Lower            Medication List    STOP taking these medications        aspirin 81 MG tablet     clindamycin 150 MG capsule  Commonly known as:  CLEOCIN      TAKE these medications        ACCU-CHEK FASTCLIX LANCETS Misc  See admin instructions.     ACCU-CHEK SMARTVIEW test strip  Generic drug:  glucose blood     calcium-vitamin D 500-200 MG-UNIT per tablet  Take 1 tablet by mouth 2 (two) times daily.     co-enzyme Q-10 30 MG capsule  Take 30 mg by mouth daily.     furosemide 40 MG tablet  Commonly known as:  LASIX  Take 80 mg by mouth every evening.     gabapentin 600 MG tablet  Commonly known as:  NEURONTIN  Take 600 mg by mouth 2 (two) times daily.     HYDROcodone-acetaminophen 5-325 MG per tablet  Commonly known as:  NORCO  Take 1-2 tablets by mouth every 6 (six) hours as needed for moderate pain.     nystatin cream  Commonly known as:  MYCOSTATIN  Apply 1 application topically daily as needed for dry skin.     omeprazole 20 MG capsule  Commonly known as:  PRILOSEC  Take 20 mg by mouth daily.     potassium chloride 10 MEQ CR capsule  Commonly known as:  MICRO-K  Take 60 mEq by mouth daily.     predniSONE 10 MG tablet  Commonly known as:  DELTASONE  Take 10 mg by mouth daily with breakfast.     rivaroxaban 20 MG Tabs tablet  Commonly known as:  XARELTO  Take 1 tablet (20 mg total) by mouth daily with supper.     silver sulfADIAZINE 1 % cream  Commonly known as:  SILVADENE  Apply 1 application topically daily.     timolol 0.5 % ophthalmic solution  Commonly known as:  TIMOPTIC  Place 1 drop into the left eye 2 (two) times daily.     traMADol 50 MG tablet  Commonly known as:  ULTRAM   Take 50 mg by mouth See admin instructions. Take three to four times daily per patient     verapamil 240 MG CR tablet  Commonly known as:  CALAN-SR  Take 240 mg by mouth at bedtime.          Diet and Activity recommendation: See Discharge Instructions  above   Consults obtained - Ortho   Major procedures and Radiology Reports - PLEASE review detailed and final reports for all details, in brief -    ORIF L Femure 07-02-14 by Alain Marion   Dg Chest 1 View  07/01/2014   CLINICAL DATA:  Fall today  EXAM: CHEST  1 VIEW  COMPARISON:  09/30/2013  FINDINGS: Cardiac enlargement without heart failure. Atherosclerotic ectatic aorta.  Negative for pneumonia or edema. Small left effusion. No displaced rib fracture is identified. Advanced degenerative change right shoulder joint.  IMPRESSION: Small left pleural effusion.  No rib fracture is identified.   Electronically Signed   By: Franchot Gallo M.D.   On: 07/01/2014 16:48   Ct Head Wo Contrast  07/01/2014   CLINICAL DATA:  [place] and hit back of head earlier today. Head pain. Neck pain.  EXAM: CT HEAD WITHOUT CONTRAST  CT CERVICAL SPINE WITHOUT CONTRAST  TECHNIQUE: Multidetector CT imaging of the head and cervical spine was performed following the standard protocol without intravenous contrast. Multiplanar CT image reconstructions of the cervical spine were also generated.  COMPARISON:  CT head 01/15/2009.  FINDINGS: CT HEAD FINDINGS  No evidence for acute infarction, hemorrhage, mass lesion, hydrocephalus, or extra-axial fluid. Generalized atrophy not unexpected for age 59. Chronic microvascular ischemic change affects periventricular and subcortical white matter. No calvarial lesions. No skull fracture. Vascular calcification. No sinus or mastoid air fluid level.  CT CERVICAL SPINE FINDINGS  There is no visible cervical spine fracture, traumatic subluxation, prevertebral soft tissue swelling, or intraspinal hematoma. Ten swan-neck deformity related to  degenerative change. Severe disc space narrowing C5-6 and C6-7. Advanced facet arthropathy at multiple levels. No worrisome osseous lesion. Atherosclerosis with kissing carotids in the nasopharyngeal midline. No lung apex lesion. No definite neck masses.  IMPRESSION: No acute intracranial findings. No skull fracture or intracranial hemorrhage  No cervical spine fracture or traumatic subluxation.   Electronically Signed   By: Rolla Flatten M.D.   On: 07/01/2014 17:13   Ct Cervical Spine Wo Contrast  07/01/2014   CLINICAL DATA:  [place] and hit back of head earlier today. Head pain. Neck pain.  EXAM: CT HEAD WITHOUT CONTRAST  CT CERVICAL SPINE WITHOUT CONTRAST  TECHNIQUE: Multidetector CT imaging of the head and cervical spine was performed following the standard protocol without intravenous contrast. Multiplanar CT image reconstructions of the cervical spine were also generated.  COMPARISON:  CT head 01/15/2009.  FINDINGS: CT HEAD FINDINGS  No evidence for acute infarction, hemorrhage, mass lesion, hydrocephalus, or extra-axial fluid. Generalized atrophy not unexpected for age 39. Chronic microvascular ischemic change affects periventricular and subcortical white matter. No calvarial lesions. No skull fracture. Vascular calcification. No sinus or mastoid air fluid level.  CT CERVICAL SPINE FINDINGS  There is no visible cervical spine fracture, traumatic subluxation, prevertebral soft tissue swelling, or intraspinal hematoma. Ten swan-neck deformity related to degenerative change. Severe disc space narrowing C5-6 and C6-7. Advanced facet arthropathy at multiple levels. No worrisome osseous lesion. Atherosclerosis with kissing carotids in the nasopharyngeal midline. No lung apex lesion. No definite neck masses.  IMPRESSION: No acute intracranial findings. No skull fracture or intracranial hemorrhage  No cervical spine fracture or traumatic subluxation.   Electronically Signed   By: Rolla Flatten M.D.   On: 07/01/2014  17:13   Pelvis Portable  07/02/2014   CLINICAL DATA:  Bipolar LEFT hip hemiarthroplasty placement.  EXAM: PORTABLE PELVIS 1-2 VIEWS  COMPARISON:  None.  FINDINGS: Single frontal view of  the pelvis demonstrates a new LEFT bipolar hip hemiarthroplasty. Old RIGHT obturator ring fractures are present. Atherosclerosis. Lumbar PLIF. No complicating features. Distal femoral stem visible and normal. No lateral view is submitted. Expected postsurgical changes in soft tissues.  IMPRESSION: Uncomplicated new LEFT bipolar hip hemiarthroplasty.   Electronically Signed   By: Dereck Ligas M.D.   On: 07/02/2014 11:33   Dg Chest Port 1 View  07/03/2014   CLINICAL DATA:  Shortness of breath. Status post left hip replacement 07/02/2014.  EXAM: PORTABLE CHEST - 1 VIEW  COMPARISON:  Single view of the chest 07/01/2014.  FINDINGS: There is cardiomegaly without edema. The lungs are clear. No pneumothorax or pleural effusion. Severe degenerative disease right shoulder is noted.  IMPRESSION: Cardiomegaly without acute disease.   Electronically Signed   By: Inge Rise M.D.   On: 07/03/2014 08:08   Dg Hip Unilat With Pelvis 2-3 Views Left  07/01/2014   CLINICAL DATA:  Fall.  EXAM: LEFT HIP (WITH PELVIS) 2-3 VIEWS  COMPARISON:  None.  FINDINGS: Left femoral neck fracture with foreshortening and angulation. Left hip joint is normal.  Chronic healed fractures of the right superior and inferior pubic rami. Right hip joint is normal. Lumbar fusion hardware noted.  IMPRESSION: Angulated fracture left femoral neck.   Electronically Signed   By: Franchot Gallo M.D.   On: 07/01/2014 16:49    Micro Results      Recent Results (from the past 240 hour(s))  Surgical pcr screen     Status: Abnormal   Collection Time: 07/01/14  9:48 PM  Result Value Ref Range Status   MRSA, PCR NEGATIVE NEGATIVE Final   Staphylococcus aureus POSITIVE (A) NEGATIVE Final    Comment:        The Xpert SA Assay (FDA approved for NASAL  specimens in patients over 54 years of age), is one component of a comprehensive surveillance program.  Test performance has been validated by [facility] for patients greater than or equal to 31 year old. It is not intended to diagnose infection nor to guide or monitor treatment.   Urine culture     Status: None   Collection Time: 07/03/14  4:01 PM  Result Value Ref Range Status   Specimen Description URINE, RANDOM  Final   Special Requests NONE  Final   Colony Count   Final    25,000 COLONIES/ML Performed at [facility]    Culture   Final    Multiple bacterial morphotypes present, none predominant. Suggest appropriate recollection if clinically indicated. Performed at [facility]    Report Status 07/04/2014 FINAL  Final       Today   Subjective:   Sreenidhi Ganson today has no headache,no chest abdominal pain,no new weakness tingling or numbness, feels much better.  Objective:   Blood pressure 123/46, pulse 64, temperature 98.5 F (36.9 C), temperature source Oral, resp. rate 18, height 5' (1.524 m), weight 80.287 kg (177 lb), SpO2 90 %.   Intake/Output Summary (Last 24 hours) at 07/05/14 0958 Last data filed at 07/05/14 0835  Gross per 24 hour  Intake    360 ml  Output    750 ml  Net   -390 ml    Exam Awake Alert, Oriented x 3, No new F.N deficits, Normal affect [place].AT,PERRAL Supple Neck,No JVD, No cervical lymphadenopathy appriciated.  Symmetrical Chest wall movement, Good air movement bilaterally, CTAB RRR,No Gallops,Rubs or new Murmurs, No Parasternal Heave +ve B.Sounds, Abd Soft, Non  tender, No organomegaly appriciated, No rebound -guarding or rigidity. No Cyanosis, Clubbing or edema, No new Rash or bruise, left hip incision site clean.  Data Review   CBC w Diff: Lab Results  Component Value Date   WBC 14.4* 07/04/2014   HGB 10.3* 07/04/2014   HCT 33.7* 07/04/2014   PLT 254 07/04/2014   LYMPHOPCT 6* 07/01/2014   MONOPCT 7  07/01/2014   EOSPCT 2 07/01/2014   BASOPCT 1 07/01/2014    CMP: Lab Results  Component Value Date   NA 139 07/02/2014   K 4.5 07/02/2014   CL 103 07/02/2014   CO2 27 07/02/2014   BUN 14 07/02/2014   CREATININE 1.11* 07/02/2014   PROT 5.8* 10/03/2013   ALBUMIN 2.8* 10/03/2013   BILITOT 0.4 10/03/2013   ALKPHOS 109 10/03/2013   AST 25 10/03/2013   ALT 15 10/03/2013  .   Total Time in preparing paper work, data evaluation and todays exam - 35 minutes  Thurnell Lose M.D on 07/05/2014 at 9:58 AM  [facility]   Office  [phone]

## 2014-07-09 DIAGNOSIS — I10 Essential (primary) hypertension: Secondary | ICD-10-CM | POA: Diagnosis not present

## 2014-07-09 DIAGNOSIS — R7302 Impaired glucose tolerance (oral): Secondary | ICD-10-CM | POA: Diagnosis not present

## 2014-07-09 DIAGNOSIS — M810 Age-related osteoporosis without current pathological fracture: Secondary | ICD-10-CM | POA: Diagnosis not present

## 2014-07-09 DIAGNOSIS — S72002D Fracture of unspecified part of neck of left femur, subsequent encounter for closed fracture with routine healing: Secondary | ICD-10-CM | POA: Diagnosis not present

## 2014-07-09 DIAGNOSIS — G629 Polyneuropathy, unspecified: Secondary | ICD-10-CM | POA: Diagnosis not present

## 2014-07-09 DIAGNOSIS — D649 Anemia, unspecified: Secondary | ICD-10-CM | POA: Diagnosis not present

## 2014-07-09 DIAGNOSIS — W010XXD Fall on same level from slipping, tripping and stumbling without subsequent striking against object, subsequent encounter: Secondary | ICD-10-CM | POA: Diagnosis not present

## 2014-07-09 DIAGNOSIS — I119 Hypertensive heart disease without heart failure: Secondary | ICD-10-CM | POA: Diagnosis not present

## 2014-07-10 DIAGNOSIS — M810 Age-related osteoporosis without current pathological fracture: Secondary | ICD-10-CM | POA: Diagnosis not present

## 2014-07-10 DIAGNOSIS — I119 Hypertensive heart disease without heart failure: Secondary | ICD-10-CM | POA: Diagnosis not present

## 2014-07-10 DIAGNOSIS — S72002D Fracture of unspecified part of neck of left femur, subsequent encounter for closed fracture with routine healing: Secondary | ICD-10-CM | POA: Diagnosis not present

## 2014-07-10 DIAGNOSIS — D649 Anemia, unspecified: Secondary | ICD-10-CM | POA: Diagnosis not present

## 2014-07-20 DIAGNOSIS — I10 Essential (primary) hypertension: Secondary | ICD-10-CM | POA: Diagnosis not present

## 2014-07-20 DIAGNOSIS — R262 Difficulty in walking, not elsewhere classified: Secondary | ICD-10-CM | POA: Diagnosis not present

## 2014-07-20 DIAGNOSIS — Z9181 History of falling: Secondary | ICD-10-CM | POA: Diagnosis not present

## 2014-07-20 DIAGNOSIS — M6281 Muscle weakness (generalized): Secondary | ICD-10-CM | POA: Diagnosis not present

## 2014-07-21 DIAGNOSIS — M6281 Muscle weakness (generalized): Secondary | ICD-10-CM | POA: Diagnosis not present

## 2014-07-21 DIAGNOSIS — Z9181 History of falling: Secondary | ICD-10-CM | POA: Diagnosis not present

## 2014-07-21 DIAGNOSIS — I10 Essential (primary) hypertension: Secondary | ICD-10-CM | POA: Diagnosis not present

## 2014-07-21 DIAGNOSIS — R262 Difficulty in walking, not elsewhere classified: Secondary | ICD-10-CM | POA: Diagnosis not present

## 2014-07-24 DIAGNOSIS — D500 Iron deficiency anemia secondary to blood loss (chronic): Secondary | ICD-10-CM | POA: Diagnosis not present

## 2014-07-24 DIAGNOSIS — Z79899 Other long term (current) drug therapy: Secondary | ICD-10-CM | POA: Diagnosis not present

## 2014-07-24 DIAGNOSIS — Z9181 History of falling: Secondary | ICD-10-CM | POA: Diagnosis not present

## 2014-07-24 DIAGNOSIS — E119 Type 2 diabetes mellitus without complications: Secondary | ICD-10-CM | POA: Diagnosis not present

## 2014-07-25 DIAGNOSIS — I10 Essential (primary) hypertension: Secondary | ICD-10-CM | POA: Diagnosis not present

## 2014-07-25 DIAGNOSIS — R262 Difficulty in walking, not elsewhere classified: Secondary | ICD-10-CM | POA: Diagnosis not present

## 2014-07-25 DIAGNOSIS — M6281 Muscle weakness (generalized): Secondary | ICD-10-CM | POA: Diagnosis not present

## 2014-07-25 DIAGNOSIS — Z9181 History of falling: Secondary | ICD-10-CM | POA: Diagnosis not present

## 2014-07-27 DIAGNOSIS — R262 Difficulty in walking, not elsewhere classified: Secondary | ICD-10-CM | POA: Diagnosis not present

## 2014-07-27 DIAGNOSIS — Z9181 History of falling: Secondary | ICD-10-CM | POA: Diagnosis not present

## 2014-07-27 DIAGNOSIS — M6281 Muscle weakness (generalized): Secondary | ICD-10-CM | POA: Diagnosis not present

## 2014-07-27 DIAGNOSIS — I10 Essential (primary) hypertension: Secondary | ICD-10-CM | POA: Diagnosis not present

## 2014-07-28 DIAGNOSIS — Z9181 History of falling: Secondary | ICD-10-CM | POA: Diagnosis not present

## 2014-07-28 DIAGNOSIS — I10 Essential (primary) hypertension: Secondary | ICD-10-CM | POA: Diagnosis not present

## 2014-07-28 DIAGNOSIS — R262 Difficulty in walking, not elsewhere classified: Secondary | ICD-10-CM | POA: Diagnosis not present

## 2014-07-28 DIAGNOSIS — M6281 Muscle weakness (generalized): Secondary | ICD-10-CM | POA: Diagnosis not present

## 2014-07-31 DIAGNOSIS — Z9181 History of falling: Secondary | ICD-10-CM | POA: Diagnosis not present

## 2014-07-31 DIAGNOSIS — M6281 Muscle weakness (generalized): Secondary | ICD-10-CM | POA: Diagnosis not present

## 2014-07-31 DIAGNOSIS — R262 Difficulty in walking, not elsewhere classified: Secondary | ICD-10-CM | POA: Diagnosis not present

## 2014-07-31 DIAGNOSIS — I10 Essential (primary) hypertension: Secondary | ICD-10-CM | POA: Diagnosis not present

## 2014-08-02 DIAGNOSIS — Z9181 History of falling: Secondary | ICD-10-CM | POA: Diagnosis not present

## 2014-08-02 DIAGNOSIS — I10 Essential (primary) hypertension: Secondary | ICD-10-CM | POA: Diagnosis not present

## 2014-08-02 DIAGNOSIS — M6281 Muscle weakness (generalized): Secondary | ICD-10-CM | POA: Diagnosis not present

## 2014-08-02 DIAGNOSIS — R262 Difficulty in walking, not elsewhere classified: Secondary | ICD-10-CM | POA: Diagnosis not present

## 2014-08-03 DIAGNOSIS — I10 Essential (primary) hypertension: Secondary | ICD-10-CM | POA: Diagnosis not present

## 2014-08-03 DIAGNOSIS — R262 Difficulty in walking, not elsewhere classified: Secondary | ICD-10-CM | POA: Diagnosis not present

## 2014-08-03 DIAGNOSIS — M6281 Muscle weakness (generalized): Secondary | ICD-10-CM | POA: Diagnosis not present

## 2014-08-03 DIAGNOSIS — Z9181 History of falling: Secondary | ICD-10-CM | POA: Diagnosis not present

## 2014-08-08 DIAGNOSIS — M6281 Muscle weakness (generalized): Secondary | ICD-10-CM | POA: Diagnosis not present

## 2014-08-08 DIAGNOSIS — Z9181 History of falling: Secondary | ICD-10-CM | POA: Diagnosis not present

## 2014-08-08 DIAGNOSIS — R262 Difficulty in walking, not elsewhere classified: Secondary | ICD-10-CM | POA: Diagnosis not present

## 2014-08-08 DIAGNOSIS — I10 Essential (primary) hypertension: Secondary | ICD-10-CM | POA: Diagnosis not present

## 2014-08-09 DIAGNOSIS — S72002D Fracture of unspecified part of neck of left femur, subsequent encounter for closed fracture with routine healing: Secondary | ICD-10-CM | POA: Diagnosis not present

## 2014-08-10 DIAGNOSIS — Z9181 History of falling: Secondary | ICD-10-CM | POA: Diagnosis not present

## 2014-08-10 DIAGNOSIS — R262 Difficulty in walking, not elsewhere classified: Secondary | ICD-10-CM | POA: Diagnosis not present

## 2014-08-10 DIAGNOSIS — M6281 Muscle weakness (generalized): Secondary | ICD-10-CM | POA: Diagnosis not present

## 2014-08-10 DIAGNOSIS — I10 Essential (primary) hypertension: Secondary | ICD-10-CM | POA: Diagnosis not present

## 2014-08-16 DIAGNOSIS — Z9181 History of falling: Secondary | ICD-10-CM | POA: Diagnosis not present

## 2014-08-16 DIAGNOSIS — I10 Essential (primary) hypertension: Secondary | ICD-10-CM | POA: Diagnosis not present

## 2014-08-16 DIAGNOSIS — R262 Difficulty in walking, not elsewhere classified: Secondary | ICD-10-CM | POA: Diagnosis not present

## 2014-08-16 DIAGNOSIS — M6281 Muscle weakness (generalized): Secondary | ICD-10-CM | POA: Diagnosis not present

## 2014-08-17 DIAGNOSIS — R262 Difficulty in walking, not elsewhere classified: Secondary | ICD-10-CM | POA: Diagnosis not present

## 2014-08-17 DIAGNOSIS — Z9181 History of falling: Secondary | ICD-10-CM | POA: Diagnosis not present

## 2014-08-17 DIAGNOSIS — M6281 Muscle weakness (generalized): Secondary | ICD-10-CM | POA: Diagnosis not present

## 2014-08-17 DIAGNOSIS — I10 Essential (primary) hypertension: Secondary | ICD-10-CM | POA: Diagnosis not present

## 2014-08-25 DIAGNOSIS — N3081 Other cystitis with hematuria: Secondary | ICD-10-CM | POA: Diagnosis not present

## 2014-08-25 DIAGNOSIS — N3080 Other cystitis without hematuria: Secondary | ICD-10-CM | POA: Diagnosis not present

## 2014-08-25 DIAGNOSIS — N3289 Other specified disorders of bladder: Secondary | ICD-10-CM | POA: Diagnosis not present

## 2014-08-28 DIAGNOSIS — R262 Difficulty in walking, not elsewhere classified: Secondary | ICD-10-CM | POA: Diagnosis not present

## 2014-08-28 DIAGNOSIS — M6281 Muscle weakness (generalized): Secondary | ICD-10-CM | POA: Diagnosis not present

## 2014-09-07 ENCOUNTER — Telehealth: Payer: Self-pay | Admitting: *Deleted

## 2014-09-07 NOTE — Telephone Encounter (Signed)
Pt request an appt for ingrown toenails.

## 2014-09-25 ENCOUNTER — Encounter: Payer: Self-pay | Admitting: Podiatry

## 2014-09-25 ENCOUNTER — Ambulatory Visit (INDEPENDENT_AMBULATORY_CARE_PROVIDER_SITE_OTHER): Payer: Medicare Other | Admitting: Podiatry

## 2014-09-25 VITALS — BP 142/63 | HR 73 | Resp 18

## 2014-09-25 DIAGNOSIS — B351 Tinea unguium: Secondary | ICD-10-CM | POA: Diagnosis not present

## 2014-09-25 DIAGNOSIS — E1140 Type 2 diabetes mellitus with diabetic neuropathy, unspecified: Secondary | ICD-10-CM

## 2014-09-25 DIAGNOSIS — L600 Ingrowing nail: Secondary | ICD-10-CM

## 2014-09-25 NOTE — Progress Notes (Signed)
Subjective:     Patient ID: Monica Evans, female   DOB: 06-20-1934, 80 y.o.   MRN: [id number]  HPIThis patient presents with pain along the outside border right big toe.  She has severe pain due to fact her nail is ingrown.  She says she has had previous but the nail returned.  Pain is in nail and her right big toe due to the toe contacture.  She presents for evaluation and treatment.   Review of Systems     Objective:   Physical Exam GENERAL APPEARANCE: Alert, conversant. Appropriately groomed. No acute distress.  VASCULAR: Pedal pulses palpable  bilateral.  Capillary refill time is immediate to all digits,  Proximal to distal cooling it warm to warm.    NEUROLOGIC: sensation is diminished epicritically and protectively to 5.07 monofilament at 5/5 sites bilateral.  Light touch is intact bilateral.  MUSCULOSKELETAL: acceptable muscle strength, tone and stability bilateral.  Intrinsic muscluature intact bilateral.  Rectus appearance of foot and digits noted bilateral. IPJ contracture right hallux.  No drainage or ulceration noted.  DERMATOLOGIC: skin color, texture, and turgor are within normal limits.  No preulcerative lesions are seen, no interdigital maceration noted.  No open lesions present. . No drainage noted.Marked incurvation noted lateral border right hallux.  Redness and swelling along lateral border.  Onychomycotic nails B/L.      Assessment:     Ingrown toenail  Onychomycosis right hallux.  Hallux malleus     Plan:     ROV  Nail surgery.  Treatment options and alternatives discussed.  Recommended permanent phenol matrixectomy and patient agreed.  Right hallux was prepped with alcohol and a toe block of 3cc of 2% lidocaine plain was administered in a .  The toe was then prepped with betadine solution .  The offending nail border was then excised and matrix tissue exposed.  Phenol was then applied to the matrix tissue followed by an alcohol wash.  Antibiotic ointment and a dry  sterile dressing was applied.  The patient was dispensed instructions for aftercare.  RTC 1 week.  Home instructions given.

## 2014-10-02 ENCOUNTER — Encounter: Payer: Self-pay | Admitting: Podiatry

## 2014-10-02 ENCOUNTER — Ambulatory Visit (INDEPENDENT_AMBULATORY_CARE_PROVIDER_SITE_OTHER): Payer: Medicare Other | Admitting: Podiatry

## 2014-10-02 VITALS — BP 68/41 | HR 71 | Resp 18

## 2014-10-02 DIAGNOSIS — Z09 Encounter for follow-up examination after completed treatment for conditions other than malignant neoplasm: Secondary | ICD-10-CM

## 2014-10-02 NOTE — Progress Notes (Signed)
Subjective:     Patient ID: Monica Evans, female   DOB: 02/17/1935, 80 y.o.   MRN: [id number]  HPIThis patient returns for evaluation following nail surgery right big toe.  She says the pain has subsided but the drainage is present.  She has been soaking as recommended.   Review of Systems     Objective:   Physical Exam GENERAL APPEARANCE: Alert, conversant. Appropriately groomed. No acute distress.  VASCULAR: Pedal pulses palpable at 2/4 DP and PT bilateral.  Capillary refill time is immediate to all digits,  Proximal to distal cooling it warm to warm.  Digital hair growth is present bilateral  NEUROLOGIC: sensation is intact epicritically and protectively to 5.07 monofilament at 5/5 sites bilateral.  Light touch is intact bilateral, vibratory sensation intact bilateral, achilles tendon reflex is intact bilateral.  MUSCULOSKELETAL: acceptable muscle strength, tone and stability bilateral.  Intrinsic muscluature intact bilateral.  Rectus appearance of foot and digits noted bilateral. IPJ contracture right hallux.  DERMATOLOGIC: skin color, texture, and turgor are within normal limits.  No preulcerative lesions or ulcers  are seen, no interdigital maceration noted.  No open lesions present. There is drainage along the medial border right big toe with no pus or redness or swelling noted.      Assessment:     S/p Nail surgery     Plan:     ROV.  Debride necrotic tissue/DSD.  Continue home soaks and bandaging.

## 2014-10-20 DIAGNOSIS — E119 Type 2 diabetes mellitus without complications: Secondary | ICD-10-CM | POA: Diagnosis not present

## 2014-10-31 DIAGNOSIS — E889 Metabolic disorder, unspecified: Secondary | ICD-10-CM | POA: Diagnosis not present

## 2014-10-31 DIAGNOSIS — G990 Autonomic neuropathy in diseases classified elsewhere: Secondary | ICD-10-CM | POA: Diagnosis not present

## 2014-10-31 DIAGNOSIS — J82 Pulmonary eosinophilia, not elsewhere classified: Secondary | ICD-10-CM | POA: Diagnosis not present

## 2014-10-31 DIAGNOSIS — E1140 Type 2 diabetes mellitus with diabetic neuropathy, unspecified: Secondary | ICD-10-CM | POA: Diagnosis not present

## 2014-10-31 DIAGNOSIS — E119 Type 2 diabetes mellitus without complications: Secondary | ICD-10-CM | POA: Diagnosis not present

## 2014-11-22 DIAGNOSIS — H4011X1 Primary open-angle glaucoma, mild stage: Secondary | ICD-10-CM | POA: Diagnosis not present

## 2014-11-22 DIAGNOSIS — Z23 Encounter for immunization: Secondary | ICD-10-CM | POA: Diagnosis not present

## 2014-12-17 IMAGING — CR DG CHEST 2V
2 series · 2 of 2 positions shown · non-contrast
Comparison: 11/28/2011.

CLINICAL DATA: Pre-admission, back surgery

EXAM:
CHEST  2 VIEW

[w chest pa]
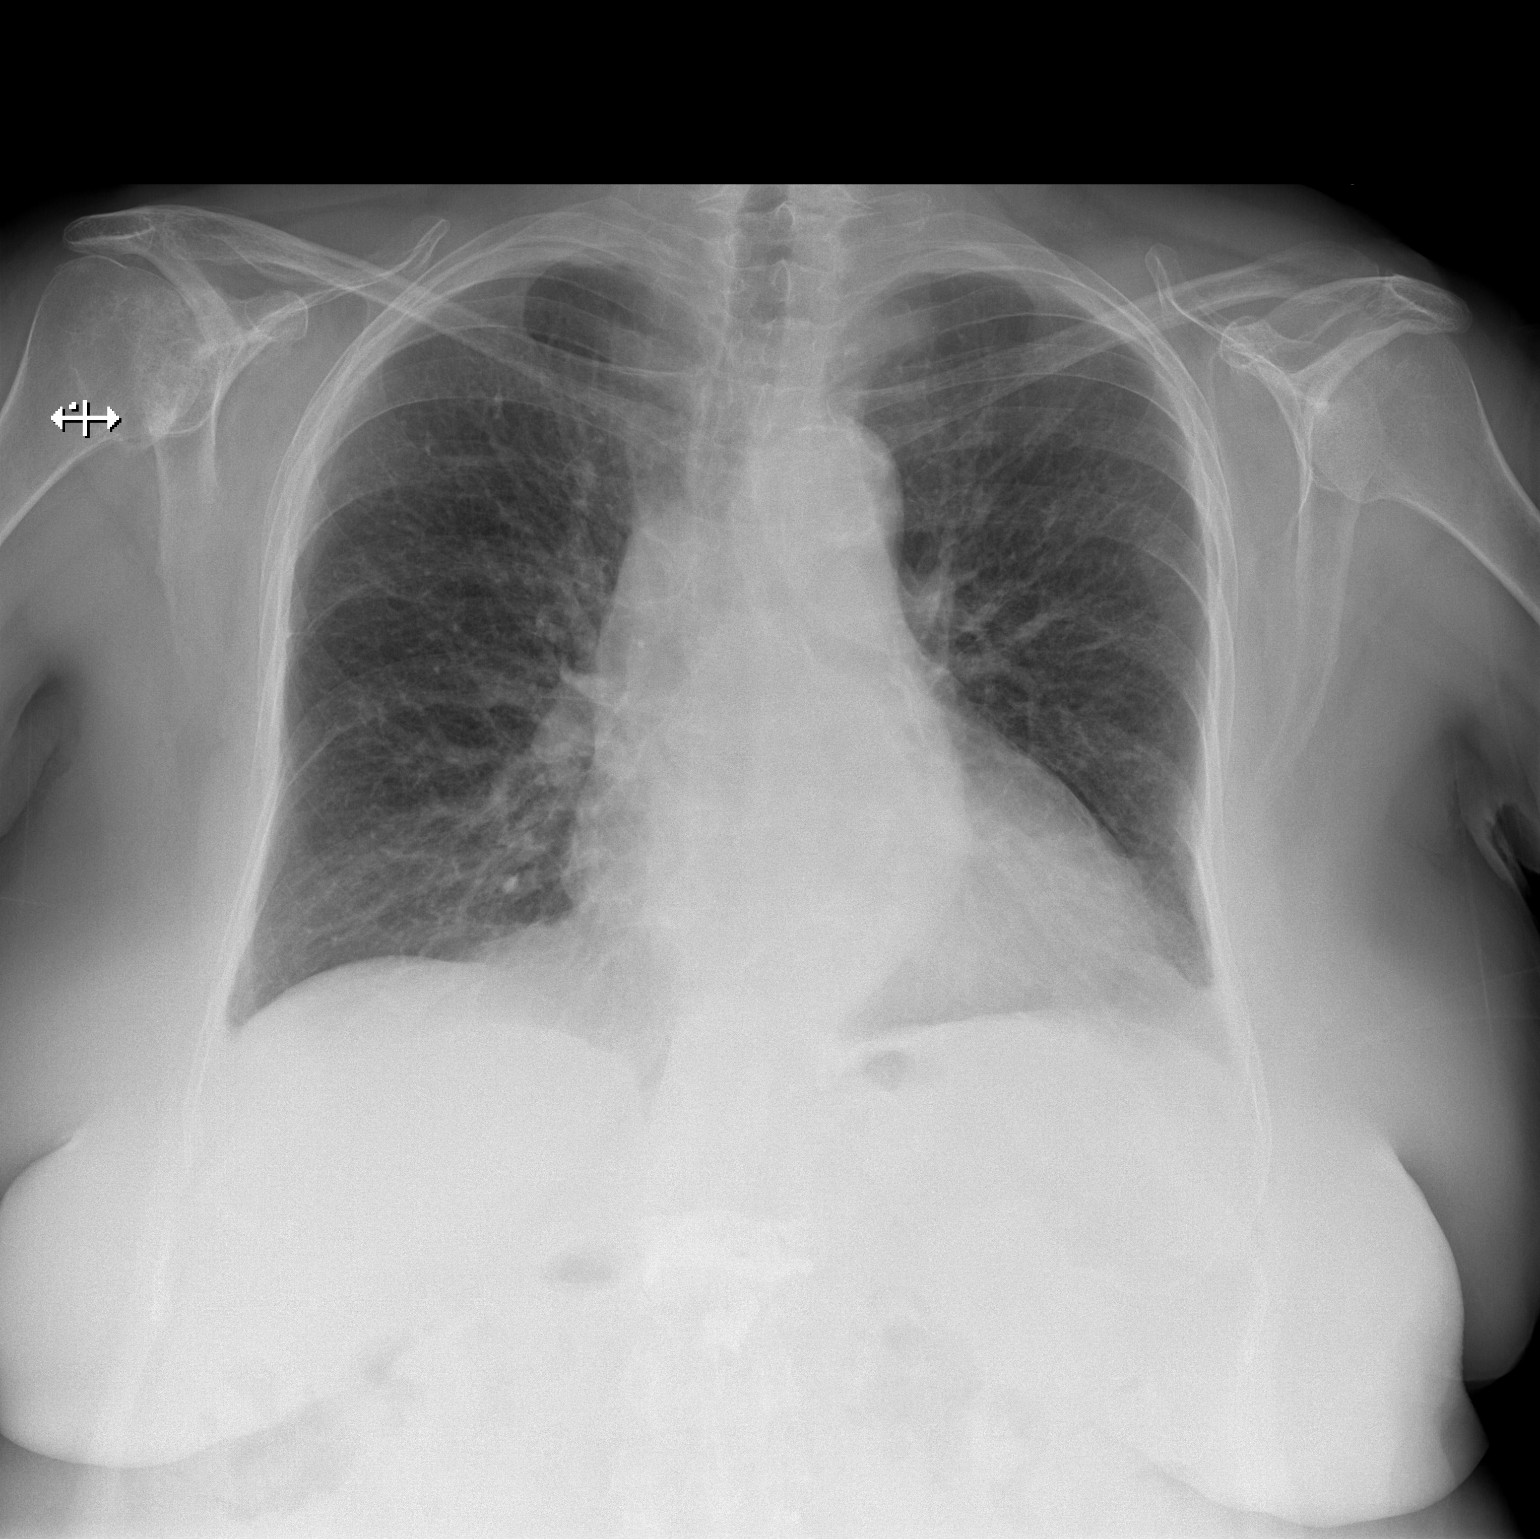

[w chest lat]
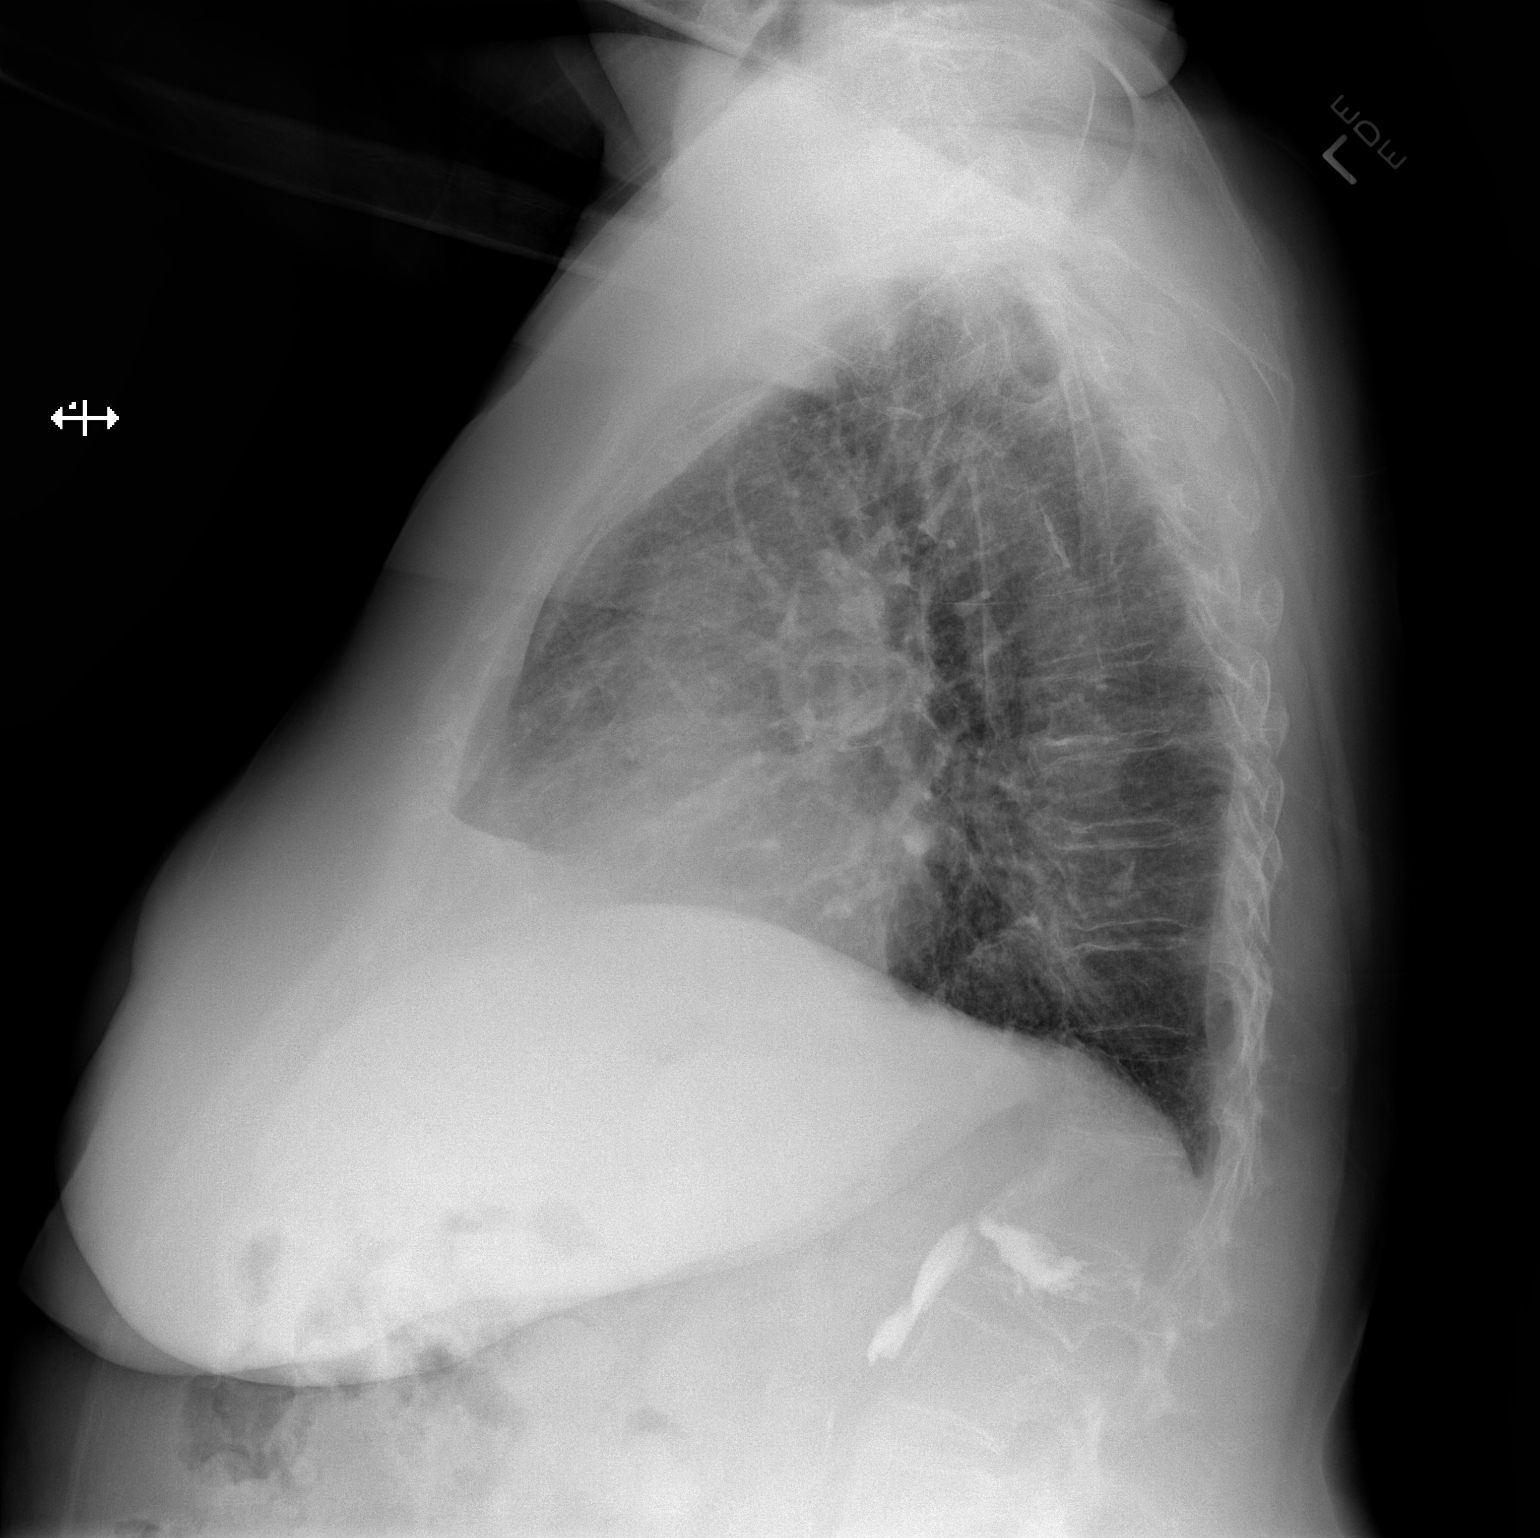

[2 of 2 positions shown; findings below may reference images not displayed]

FINDINGS: Borderline cardiomegaly. Probable chronic mild interstitial
prominence bilaterally. Prior vertebroplasty at L2 level.
Atherosclerotic calcifications of thoracic aorta. No pulmonary
edema. There is streaky left base retrocardiac atelectasis or
infiltrate.
IMPRESSION: Probable chronic mild interstitial prominence without convincing
pulmonary edema. Streaky left base retrocardiac atelectasis or
infiltrate. Prior vertebroplasty at L2 level.

## 2015-01-04 DIAGNOSIS — L821 Other seborrheic keratosis: Secondary | ICD-10-CM | POA: Diagnosis not present

## 2015-01-04 DIAGNOSIS — L578 Other skin changes due to chronic exposure to nonionizing radiation: Secondary | ICD-10-CM | POA: Diagnosis not present

## 2015-01-04 DIAGNOSIS — L570 Actinic keratosis: Secondary | ICD-10-CM | POA: Diagnosis not present

## 2015-01-04 DIAGNOSIS — C44329 Squamous cell carcinoma of skin of other parts of face: Secondary | ICD-10-CM | POA: Diagnosis not present

## 2015-01-09 DIAGNOSIS — C44329 Squamous cell carcinoma of skin of other parts of face: Secondary | ICD-10-CM | POA: Diagnosis not present

## 2015-01-25 ENCOUNTER — Ambulatory Visit (INDEPENDENT_AMBULATORY_CARE_PROVIDER_SITE_OTHER): Payer: Medicare Other | Admitting: Sports Medicine

## 2015-01-25 ENCOUNTER — Encounter: Payer: Self-pay | Admitting: Sports Medicine

## 2015-01-25 DIAGNOSIS — E1140 Type 2 diabetes mellitus with diabetic neuropathy, unspecified: Secondary | ICD-10-CM

## 2015-01-25 DIAGNOSIS — M79676 Pain in unspecified toe(s): Secondary | ICD-10-CM

## 2015-01-25 DIAGNOSIS — B351 Tinea unguium: Secondary | ICD-10-CM | POA: Diagnosis not present

## 2015-01-25 NOTE — Progress Notes (Signed)
Patient ID: Monica Evans, female   DOB: 10-23-1934, 80 y.o.   MRN: [id number] Subjective: Monica Evans is a 80 y.o. female patient with history of type 2 diabetes who presents to office today complaining of long, painful nails  while ambulating in shoes; unable to trim. Patient states that the glucose reading this morning was not recorded. Patient denies any new changes in medication or new problems. Patient denies any new cramping, numbness, burning or tingling in the legs.  Patient Active Problem List   Diagnosis Date Noted  . Left displaced femoral neck fracture ([place]) 07/01/2014  . Essential hypertension 07/01/2014  . Impaired glucose tolerance 07/01/2014  . Peripheral neuropathy ([place]) 07/01/2014  . DVT (deep venous thrombosis) ([place]) 10/03/2013  . Lumbar burst fracture 08/29/2013   Current Outpatient Prescriptions on File Prior to Visit  Medication Sig Dispense Refill  . ACCU-CHEK FASTCLIX LANCETS MISC See admin instructions.  12  . ACCU-CHEK SMARTVIEW test strip   3  . Calcium Carbonate-Vitamin D (CALCIUM-VITAMIN D) 500-200 MG-UNIT per tablet Take 1 tablet by mouth 2 (two) times daily.    Marland Kitchen co-enzyme Q-10 30 MG capsule Take 30 mg by mouth daily.    . fluconazole (DIFLUCAN) 200 MG tablet Take 200 mg by mouth daily.  0  . furosemide (LASIX) 40 MG tablet Take 80 mg by mouth every evening.    . gabapentin (NEURONTIN) 600 MG tablet Take 600 mg by mouth 2 (two) times daily.  0  . gabapentin (NEURONTIN) 800 MG tablet Take 800 mg by mouth 3 (three) times daily.  0  . HYDROcodone-acetaminophen (NORCO) 5-325 MG per tablet Take 1-2 tablets by mouth every 6 (six) hours as needed for moderate pain. 90 tablet 0  . levofloxacin (LEVAQUIN) 500 MG tablet Take 500 mg by mouth daily.  0  . nystatin cream (MYCOSTATIN) Apply 1 application topically daily as needed for dry skin.   12  . omeprazole (PRILOSEC) 20 MG capsule Take 20 mg by mouth daily.     . potassium chloride (MICRO-K) 10 MEQ CR capsule Take  60 mEq by mouth daily.     . predniSONE (DELTASONE) 10 MG tablet Take 10 mg by mouth daily with breakfast.    . rivaroxaban (XARELTO) 20 MG TABS tablet Take 1 tablet (20 mg total) by mouth daily with supper. 90 tablet 0  . silver sulfADIAZINE (SILVADENE) 1 % cream Apply 1 application topically daily. (Patient not taking: Reported on 07/01/2014) 50 g 0  . timolol (TIMOPTIC) 0.5 % ophthalmic solution Place 1 drop into the left eye 2 (two) times daily.   3  . traMADol (ULTRAM) 50 MG tablet Take 50 mg by mouth See admin instructions. Take three to four times daily per patient    . verapamil (CALAN-SR) 240 MG CR tablet Take 240 mg by mouth at bedtime.     No current facility-administered medications on file prior to visit.   Allergies  Allergen Reactions  . Penicillins Swelling    Tolerated Ancef 08/29/13, tdd     Labs: HEMOGLOBIN A1C- No recent lab on file; patient reports that she is trying to keep her A1C down.  Objective: General: Patient is awake, alert, and oriented x 3 and in no acute distress. Cane assisted gait   Integument: Skin is cool, dry and supple bilateral. Nails are tender, long, thickened and  dystrophic with subungual debris, consistent with onychomycosis, 1-5 bilateral. No signs of infection. No open lesions or preulcerative lesions present bilateral. Remaining integument unremarkable.  Vasculature:  Dorsalis Pedis pulse 1/4 bilateral. Posterior Tibial pulse  1/4 bilateral.  Capillary fill time <4 sec 1-5 bilateral. Scant hair growth to the level of the digits. Temperature gradient decreased bilateral. + varicosities present bilateral. No edema present bilateral.   Neurology: The patient has intact sensation measured with a 5.07/10g Semmes Weinstein Monofilament at all pedal sites bilateral . Vibratory sensation diminished bilateral with tuning fork. No Babinski sign present bilateral.   Musculoskeletal: Bunion and hammer toe deformities noted bilateral; Right hallux is  most contracted with hammertoe deformity. Muscular strength 5/5 in all lower extremity muscular groups bilateral without pain or limitation on range of motion . No tenderness with calf compression bilateral.  Assessment and Plan: Problem List Items Addressed This Visit    None    Visit Diagnoses    Type 2 diabetes mellitus with diabetic neuropathy, unspecified long term insulin use status ([place])    -  Primary    Pain due to onychomycosis of toenail          -Examined patient. -Discussed and educated patient on diabetic foot care, especially with  regards to the vascular, neurological and musculoskeletal systems.  -Stressed the importance of good glycemic control and the detriment of not  controlling glucose levels in relation to the foot. -Mechanically debrided all nails 1-5 bilateral using sterile nail nipper and filed with dremel without incident  -Gave patient silicone toe cover for right big toe to wear daily while in shoes to prevent rubbing. -Answered all patient questions -Patient to return in 3 months for at risk foot care -Patient advised to call the office if any problems or questions arise in the  Meantime.  Monica Evans, DPM

## 2015-04-18 ENCOUNTER — Telehealth: Payer: Self-pay | Admitting: *Deleted

## 2015-04-18 NOTE — Telephone Encounter (Signed)
Pt states she has an appt with the dermatologist the same time as our appt and needs to reschedule our appt time.

## 2015-04-25 ENCOUNTER — Ambulatory Visit: Payer: Medicare Other | Admitting: Sports Medicine

## 2015-04-25 DIAGNOSIS — L570 Actinic keratosis: Secondary | ICD-10-CM | POA: Diagnosis not present

## 2015-04-25 DIAGNOSIS — C44329 Squamous cell carcinoma of skin of other parts of face: Secondary | ICD-10-CM | POA: Diagnosis not present

## 2015-04-26 ENCOUNTER — Encounter: Payer: Self-pay | Admitting: Sports Medicine

## 2015-04-26 ENCOUNTER — Ambulatory Visit (INDEPENDENT_AMBULATORY_CARE_PROVIDER_SITE_OTHER): Payer: Medicare Other | Admitting: Sports Medicine

## 2015-04-26 DIAGNOSIS — M79676 Pain in unspecified toe(s): Secondary | ICD-10-CM

## 2015-04-26 DIAGNOSIS — B351 Tinea unguium: Secondary | ICD-10-CM

## 2015-04-26 DIAGNOSIS — E1140 Type 2 diabetes mellitus with diabetic neuropathy, unspecified: Secondary | ICD-10-CM | POA: Diagnosis not present

## 2015-04-26 NOTE — Progress Notes (Signed)
Patient ID: Monica Evans, female   DOB: [birth year], 81 y.o.   MRN: [id number]  Subjective: Monica Evans is a 81 y.o. female patient with history of type 2 diabetes who presents to office today complaining of long, painful nails  while ambulating in shoes; unable to trim. Patient states that the glucose reading this morning was not recorded , but yesterday evening was 123. Patient denies any new changes in medication or new problems. Patient denies any new cramping, numbness, burning or tingling in the legs.   Admits she went to a dermatologist and had some cancers areas cut off on her face.  Patient Active Problem List   Diagnosis Date Noted  . Left displaced femoral neck fracture ([place]) 07/01/2014  . Essential hypertension 07/01/2014  . Impaired glucose tolerance 07/01/2014  . Peripheral neuropathy ([place]) 07/01/2014  . DVT (deep venous thrombosis) ([place]) 10/03/2013  . Lumbar burst fracture 08/29/2013   Current Outpatient Prescriptions on File Prior to Visit  Medication Sig Dispense Refill  . ACCU-CHEK FASTCLIX LANCETS MISC See admin instructions.  12  . ACCU-CHEK SMARTVIEW test strip   3  . Calcium Carbonate-Vitamin D (CALCIUM-VITAMIN D) 500-200 MG-UNIT per tablet Take 1 tablet by mouth 2 (two) times daily.    Marland Kitchen co-enzyme Q-10 30 MG capsule Take 30 mg by mouth daily.    . fluconazole (DIFLUCAN) 200 MG tablet Take 200 mg by mouth daily.  0  . furosemide (LASIX) 40 MG tablet Take 80 mg by mouth every evening.    . gabapentin (NEURONTIN) 600 MG tablet Take 600 mg by mouth 2 (two) times daily.  0  . gabapentin (NEURONTIN) 800 MG tablet Take 800 mg by mouth 3 (three) times daily.  0  . HYDROcodone-acetaminophen (NORCO) 5-325 MG per tablet Take 1-2 tablets by mouth every 6 (six) hours as needed for moderate pain. 90 tablet 0  . levofloxacin (LEVAQUIN) 500 MG tablet Take 500 mg by mouth daily.  0  . nystatin cream (MYCOSTATIN) Apply 1 application topically daily as needed for dry skin.   12  .  omeprazole (PRILOSEC) 20 MG capsule Take 20 mg by mouth daily.     . potassium chloride (MICRO-K) 10 MEQ CR capsule Take 60 mEq by mouth daily.     . predniSONE (DELTASONE) 10 MG tablet Take 10 mg by mouth daily with breakfast.    . rivaroxaban (XARELTO) 20 MG TABS tablet Take 1 tablet (20 mg total) by mouth daily with supper. 90 tablet 0  . silver sulfADIAZINE (SILVADENE) 1 % cream Apply 1 application topically daily. (Patient not taking: Reported on 07/01/2014) 50 g 0  . timolol (TIMOPTIC) 0.5 % ophthalmic solution Place 1 drop into the left eye 2 (two) times daily.   3  . traMADol (ULTRAM) 50 MG tablet Take 50 mg by mouth See admin instructions. Take three to four times daily per patient    . verapamil (CALAN-SR) 240 MG CR tablet Take 240 mg by mouth at bedtime.     No current facility-administered medications on file prior to visit.   Allergies  Allergen Reactions  . Penicillins Swelling    Tolerated Ancef 08/29/13, tdd    Objective: General: Patient is awake, alert, and oriented x 3 and in no acute distress. Cane assisted gait   Integument: Skin is cool, dry and supple bilateral. Nails are tender, long, thickened and  dystrophic with subungual debris, consistent with onychomycosis, 1-5 bilateral. No signs of infection. No open lesions or preulcerative lesions present bilateral.  Remaining integument unremarkable.  Vasculature:  Dorsalis Pedis pulse 1/4 bilateral. Posterior Tibial pulse  1/4 bilateral.  Capillary fill time <4 sec 1-5 bilateral. Scant hair growth to the level of the digits. Temperature gradient decreased bilateral. + varicosities present bilateral. No edema present bilateral.   Neurology: The patient has intact sensation measured with a 5.07/10g Semmes Weinstein Monofilament at all pedal sites bilateral . Vibratory sensation diminished bilateral with tuning fork. No Babinski sign present bilateral.   Musculoskeletal: Bunion and hammer toe deformities noted bilateral;  Right hallux is most contracted with hammertoe deformity. Muscular strength 5/5 in all lower extremity muscular groups bilateral without pain or limitation on range of motion . No tenderness with calf compression bilateral.  Assessment and Plan: Problem List Items Addressed This Visit    None    Visit Diagnoses    Pain due to onychomycosis of toenail    -  Primary    Type 2 diabetes mellitus with diabetic neuropathy, unspecified long term insulin use status ([place])          -Examined patient. -Discussed and educated patient on diabetic foot care, especially with  regards to the vascular, neurological and musculoskeletal systems.  -Stressed the importance of good glycemic control and the detriment of not  controlling glucose levels in relation to the foot. -Mechanically debrided all nails 1-5 bilateral using sterile nail nipper and filed with dremel without incident  -Continue with silicone toe cover for right big toe to wear daily while in shoes to prevent rubbing. -Answered all patient questions -Patient to return in 3 months for at risk foot care -Patient advised to call the office if any problems or questions arise in the meantime.  Landis Martins, DPM

## 2015-05-02 ENCOUNTER — Ambulatory Visit: Payer: Medicare Other | Admitting: Sports Medicine

## 2015-05-02 DIAGNOSIS — J82 Pulmonary eosinophilia, not elsewhere classified: Secondary | ICD-10-CM | POA: Diagnosis not present

## 2015-05-02 DIAGNOSIS — Z0000 Encounter for general adult medical examination without abnormal findings: Secondary | ICD-10-CM | POA: Diagnosis not present

## 2015-05-02 DIAGNOSIS — E1140 Type 2 diabetes mellitus with diabetic neuropathy, unspecified: Secondary | ICD-10-CM | POA: Diagnosis not present

## 2015-05-02 DIAGNOSIS — E889 Metabolic disorder, unspecified: Secondary | ICD-10-CM | POA: Diagnosis not present

## 2015-05-02 DIAGNOSIS — M9983 Other biomechanical lesions of lumbar region: Secondary | ICD-10-CM | POA: Diagnosis not present

## 2015-05-02 DIAGNOSIS — Z79899 Other long term (current) drug therapy: Secondary | ICD-10-CM | POA: Diagnosis not present

## 2015-05-02 DIAGNOSIS — M810 Age-related osteoporosis without current pathological fracture: Secondary | ICD-10-CM | POA: Diagnosis not present

## 2015-05-02 DIAGNOSIS — I10 Essential (primary) hypertension: Secondary | ICD-10-CM | POA: Diagnosis not present

## 2015-05-09 DIAGNOSIS — R262 Difficulty in walking, not elsewhere classified: Secondary | ICD-10-CM | POA: Diagnosis not present

## 2015-05-09 DIAGNOSIS — M25552 Pain in left hip: Secondary | ICD-10-CM | POA: Diagnosis not present

## 2015-05-11 DIAGNOSIS — R262 Difficulty in walking, not elsewhere classified: Secondary | ICD-10-CM | POA: Diagnosis not present

## 2015-05-11 DIAGNOSIS — M25552 Pain in left hip: Secondary | ICD-10-CM | POA: Diagnosis not present

## 2015-05-15 DIAGNOSIS — M25552 Pain in left hip: Secondary | ICD-10-CM | POA: Diagnosis not present

## 2015-05-15 DIAGNOSIS — R262 Difficulty in walking, not elsewhere classified: Secondary | ICD-10-CM | POA: Diagnosis not present

## 2015-05-17 DIAGNOSIS — R262 Difficulty in walking, not elsewhere classified: Secondary | ICD-10-CM | POA: Diagnosis not present

## 2015-05-17 DIAGNOSIS — M25552 Pain in left hip: Secondary | ICD-10-CM | POA: Diagnosis not present

## 2015-05-25 DIAGNOSIS — R262 Difficulty in walking, not elsewhere classified: Secondary | ICD-10-CM | POA: Diagnosis not present

## 2015-05-25 DIAGNOSIS — M25552 Pain in left hip: Secondary | ICD-10-CM | POA: Diagnosis not present

## 2015-05-28 DIAGNOSIS — R262 Difficulty in walking, not elsewhere classified: Secondary | ICD-10-CM | POA: Diagnosis not present

## 2015-05-28 DIAGNOSIS — M25552 Pain in left hip: Secondary | ICD-10-CM | POA: Diagnosis not present

## 2015-05-30 DIAGNOSIS — H401121 Primary open-angle glaucoma, left eye, mild stage: Secondary | ICD-10-CM | POA: Diagnosis not present

## 2015-06-01 DIAGNOSIS — M25552 Pain in left hip: Secondary | ICD-10-CM | POA: Diagnosis not present

## 2015-06-01 DIAGNOSIS — R262 Difficulty in walking, not elsewhere classified: Secondary | ICD-10-CM | POA: Diagnosis not present

## 2015-06-08 DIAGNOSIS — M25552 Pain in left hip: Secondary | ICD-10-CM | POA: Diagnosis not present

## 2015-06-08 DIAGNOSIS — R262 Difficulty in walking, not elsewhere classified: Secondary | ICD-10-CM | POA: Diagnosis not present

## 2015-06-11 DIAGNOSIS — S0093XA Contusion of unspecified part of head, initial encounter: Secondary | ICD-10-CM | POA: Diagnosis not present

## 2015-06-11 DIAGNOSIS — S42292A Other displaced fracture of upper end of left humerus, initial encounter for closed fracture: Secondary | ICD-10-CM | POA: Diagnosis not present

## 2015-06-11 DIAGNOSIS — I10 Essential (primary) hypertension: Secondary | ICD-10-CM | POA: Diagnosis not present

## 2015-06-11 DIAGNOSIS — G2581 Restless legs syndrome: Secondary | ICD-10-CM | POA: Diagnosis not present

## 2015-06-11 DIAGNOSIS — S0083XA Contusion of other part of head, initial encounter: Secondary | ICD-10-CM | POA: Diagnosis not present

## 2015-06-11 DIAGNOSIS — M810 Age-related osteoporosis without current pathological fracture: Secondary | ICD-10-CM | POA: Diagnosis not present

## 2015-06-11 DIAGNOSIS — M79601 Pain in right arm: Secondary | ICD-10-CM | POA: Diagnosis not present

## 2015-06-11 DIAGNOSIS — S8012XA Contusion of left lower leg, initial encounter: Secondary | ICD-10-CM | POA: Diagnosis not present

## 2015-06-11 DIAGNOSIS — S42202A Unspecified fracture of upper end of left humerus, initial encounter for closed fracture: Secondary | ICD-10-CM | POA: Diagnosis not present

## 2015-06-11 DIAGNOSIS — S0990XA Unspecified injury of head, initial encounter: Secondary | ICD-10-CM | POA: Diagnosis not present

## 2015-06-11 DIAGNOSIS — M79662 Pain in left lower leg: Secondary | ICD-10-CM | POA: Diagnosis not present

## 2015-06-11 DIAGNOSIS — R51 Headache: Secondary | ICD-10-CM | POA: Diagnosis not present

## 2015-06-11 DIAGNOSIS — M25512 Pain in left shoulder: Secondary | ICD-10-CM | POA: Diagnosis not present

## 2015-06-15 DIAGNOSIS — S0093XA Contusion of unspecified part of head, initial encounter: Secondary | ICD-10-CM | POA: Diagnosis not present

## 2015-06-15 DIAGNOSIS — S42352A Displaced comminuted fracture of shaft of humerus, left arm, initial encounter for closed fracture: Secondary | ICD-10-CM | POA: Diagnosis not present

## 2015-06-18 DIAGNOSIS — S42212A Unspecified displaced fracture of surgical neck of left humerus, initial encounter for closed fracture: Secondary | ICD-10-CM | POA: Diagnosis not present

## 2015-06-21 DIAGNOSIS — S42212A Unspecified displaced fracture of surgical neck of left humerus, initial encounter for closed fracture: Secondary | ICD-10-CM | POA: Diagnosis not present

## 2015-06-27 DIAGNOSIS — S42212A Unspecified displaced fracture of surgical neck of left humerus, initial encounter for closed fracture: Secondary | ICD-10-CM | POA: Diagnosis not present

## 2015-07-02 DIAGNOSIS — S72002D Fracture of unspecified part of neck of left femur, subsequent encounter for closed fracture with routine healing: Secondary | ICD-10-CM | POA: Diagnosis not present

## 2015-07-20 DIAGNOSIS — J069 Acute upper respiratory infection, unspecified: Secondary | ICD-10-CM | POA: Diagnosis not present

## 2015-07-20 DIAGNOSIS — R062 Wheezing: Secondary | ICD-10-CM | POA: Diagnosis not present

## 2015-07-20 DIAGNOSIS — J189 Pneumonia, unspecified organism: Secondary | ICD-10-CM | POA: Diagnosis not present

## 2015-07-25 ENCOUNTER — Ambulatory Visit: Payer: Medicare Other | Admitting: Sports Medicine

## 2015-07-25 DIAGNOSIS — S72002D Fracture of unspecified part of neck of left femur, subsequent encounter for closed fracture with routine healing: Secondary | ICD-10-CM | POA: Diagnosis not present

## 2015-07-25 DIAGNOSIS — M25512 Pain in left shoulder: Secondary | ICD-10-CM | POA: Diagnosis not present

## 2015-07-26 ENCOUNTER — Ambulatory Visit (INDEPENDENT_AMBULATORY_CARE_PROVIDER_SITE_OTHER): Payer: Medicare Other | Admitting: Sports Medicine

## 2015-07-26 ENCOUNTER — Encounter: Payer: Self-pay | Admitting: Sports Medicine

## 2015-07-26 DIAGNOSIS — B351 Tinea unguium: Secondary | ICD-10-CM | POA: Diagnosis not present

## 2015-07-26 DIAGNOSIS — E1140 Type 2 diabetes mellitus with diabetic neuropathy, unspecified: Secondary | ICD-10-CM

## 2015-07-26 DIAGNOSIS — M79676 Pain in unspecified toe(s): Secondary | ICD-10-CM | POA: Diagnosis not present

## 2015-07-26 NOTE — Progress Notes (Signed)
Patient ID: Monica Evans, female   DOB: Mar 08, 1935, 81 y.o.   MRN: [id number]  Subjective: Monica Evans is a 81 y.o. female patient with history of type 2 diabetes who presents to office today complaining of long, painful nails  while ambulating in shoes; unable to trim. Patient states that the glucose reading this morning was not recorded , but yesterday evening was 120. Patient denies any new changes in medication or new problems. Patient denies any new cramping, numbness, burning or tingling in the legs.  Patient is assisted by CNA name Monica Evans who also reports that patient tripped over a rug about 6 weeks ago fell and broke her left arm.   Patient Active Problem List   Diagnosis Date Noted  . Left displaced femoral neck fracture ([place]) 07/01/2014  . Essential hypertension 07/01/2014  . Impaired glucose tolerance 07/01/2014  . Peripheral neuropathy ([place]) 07/01/2014  . DVT (deep venous thrombosis) ([place]) 10/03/2013  . Lumbar burst fracture 08/29/2013   Current Outpatient Prescriptions on File Prior to Visit  Medication Sig Dispense Refill  . ACCU-CHEK FASTCLIX LANCETS MISC See admin instructions.  12  . ACCU-CHEK SMARTVIEW test strip   3  . Calcium Carbonate-Vitamin D (CALCIUM-VITAMIN D) 500-200 MG-UNIT per tablet Take 1 tablet by mouth 2 (two) times daily.    Marland Kitchen co-enzyme Q-10 30 MG capsule Take 30 mg by mouth daily.    . fluconazole (DIFLUCAN) 200 MG tablet Take 200 mg by mouth daily.  0  . furosemide (LASIX) 40 MG tablet Take 80 mg by mouth every evening.    . gabapentin (NEURONTIN) 600 MG tablet Take 600 mg by mouth 2 (two) times daily.  0  . gabapentin (NEURONTIN) 800 MG tablet Take 800 mg by mouth 3 (three) times daily.  0  . HYDROcodone-acetaminophen (NORCO) 5-325 MG per tablet Take 1-2 tablets by mouth every 6 (six) hours as needed for moderate pain. 90 tablet 0  . levofloxacin (LEVAQUIN) 500 MG tablet Take 500 mg by mouth daily.  0  . nystatin cream (MYCOSTATIN) Apply 1  application topically daily as needed for dry skin.   12  . omeprazole (PRILOSEC) 20 MG capsule Take 20 mg by mouth daily.     . potassium chloride (MICRO-K) 10 MEQ CR capsule Take 60 mEq by mouth daily.     . predniSONE (DELTASONE) 10 MG tablet Take 10 mg by mouth daily with breakfast.    . rivaroxaban (XARELTO) 20 MG TABS tablet Take 1 tablet (20 mg total) by mouth daily with supper. 90 tablet 0  . silver sulfADIAZINE (SILVADENE) 1 % cream Apply 1 application topically daily. (Patient not taking: Reported on 07/01/2014) 50 g 0  . timolol (TIMOPTIC) 0.5 % ophthalmic solution Place 1 drop into the left eye 2 (two) times daily.   3  . traMADol (ULTRAM) 50 MG tablet Take 50 mg by mouth See admin instructions. Take three to four times daily per patient    . verapamil (CALAN-SR) 240 MG CR tablet Take 240 mg by mouth at bedtime.     No current facility-administered medications on file prior to visit.   Allergies  Allergen Reactions  . Penicillins Swelling    Tolerated Ancef 08/29/13, tdd    Objective: General: Patient is awake, alert, and oriented x 3 and in no acute distress. Cane assisted gait   Integument: Skin is cool, dry and supple bilateral. Nails are tender, long, thickened and  dystrophic with subungual debris, consistent with onychomycosis, 1-5 bilateral. No signs  of infection. No open lesions or preulcerative lesions present bilateral. Remaining integument unremarkable.  Vasculature:  Dorsalis Pedis pulse 1/4 bilateral. Posterior Tibial pulse  1/4 bilateral.  Capillary fill time <4 sec 1-5 bilateral. Scant hair growth to the level of the digits. Temperature gradient decreased bilateral. + varicosities present bilateral. No edema present bilateral.   Neurology: The patient has intact sensation measured with a 5.07/10g Semmes Weinstein Monofilament at all pedal sites bilateral . Vibratory sensation diminished bilateral with tuning fork. No Babinski sign present bilateral.    Musculoskeletal: Bunion and hammer toe deformities noted bilateral; Right hallux is most contracted with hammertoe deformity. Muscular strength 5/5 in all lower extremity muscular groups bilateral without pain or limitation on range of motion . No tenderness with calf compression bilateral.  Assessment and Plan: Problem List Items Addressed This Visit    None    Visit Diagnoses    Pain due to onychomycosis of toenail    -  Primary    Type 2 diabetes mellitus with diabetic neuropathy, unspecified long term insulin use status ([place])          -Examined patient. -Discussed and educated patient on diabetic foot care, especially with  regards to the vascular, neurological and musculoskeletal systems.  -Stressed the importance of good glycemic control and the detriment of not  controlling glucose levels in relation to the foot. -Mechanically debrided all nails 1-5 bilateral using sterile nail nipper and filed with dremel without incident  -Gave new silicone toe pad for right big toe to wear daily while in shoes to prevent rubbing. -Answered all patient questions -Patient to return in 3 months for at risk foot care -Patient advised to call the office if any problems or questions arise in the meantime.  Landis Martins, DPM

## 2015-07-27 DIAGNOSIS — I77810 Thoracic aortic ectasia: Secondary | ICD-10-CM | POA: Diagnosis not present

## 2015-07-27 DIAGNOSIS — J40 Bronchitis, not specified as acute or chronic: Secondary | ICD-10-CM | POA: Diagnosis not present

## 2015-07-29 DIAGNOSIS — S42202D Unspecified fracture of upper end of left humerus, subsequent encounter for fracture with routine healing: Secondary | ICD-10-CM | POA: Diagnosis not present

## 2015-07-29 DIAGNOSIS — I10 Essential (primary) hypertension: Secondary | ICD-10-CM | POA: Diagnosis not present

## 2015-07-29 DIAGNOSIS — W19XXXD Unspecified fall, subsequent encounter: Secondary | ICD-10-CM | POA: Diagnosis not present

## 2015-07-29 DIAGNOSIS — E1142 Type 2 diabetes mellitus with diabetic polyneuropathy: Secondary | ICD-10-CM | POA: Diagnosis not present

## 2015-07-29 DIAGNOSIS — Z96642 Presence of left artificial hip joint: Secondary | ICD-10-CM | POA: Diagnosis not present

## 2015-07-29 DIAGNOSIS — Z86718 Personal history of other venous thrombosis and embolism: Secondary | ICD-10-CM | POA: Diagnosis not present

## 2015-07-31 DIAGNOSIS — I10 Essential (primary) hypertension: Secondary | ICD-10-CM | POA: Diagnosis not present

## 2015-07-31 DIAGNOSIS — Z86718 Personal history of other venous thrombosis and embolism: Secondary | ICD-10-CM | POA: Diagnosis not present

## 2015-07-31 DIAGNOSIS — Z96642 Presence of left artificial hip joint: Secondary | ICD-10-CM | POA: Diagnosis not present

## 2015-07-31 DIAGNOSIS — S42202D Unspecified fracture of upper end of left humerus, subsequent encounter for fracture with routine healing: Secondary | ICD-10-CM | POA: Diagnosis not present

## 2015-07-31 DIAGNOSIS — W19XXXD Unspecified fall, subsequent encounter: Secondary | ICD-10-CM | POA: Diagnosis not present

## 2015-07-31 DIAGNOSIS — E1142 Type 2 diabetes mellitus with diabetic polyneuropathy: Secondary | ICD-10-CM | POA: Diagnosis not present

## 2015-08-02 DIAGNOSIS — Z86718 Personal history of other venous thrombosis and embolism: Secondary | ICD-10-CM | POA: Diagnosis not present

## 2015-08-02 DIAGNOSIS — E1142 Type 2 diabetes mellitus with diabetic polyneuropathy: Secondary | ICD-10-CM | POA: Diagnosis not present

## 2015-08-02 DIAGNOSIS — W19XXXD Unspecified fall, subsequent encounter: Secondary | ICD-10-CM | POA: Diagnosis not present

## 2015-08-02 DIAGNOSIS — I10 Essential (primary) hypertension: Secondary | ICD-10-CM | POA: Diagnosis not present

## 2015-08-02 DIAGNOSIS — S42202D Unspecified fracture of upper end of left humerus, subsequent encounter for fracture with routine healing: Secondary | ICD-10-CM | POA: Diagnosis not present

## 2015-08-02 DIAGNOSIS — Z96642 Presence of left artificial hip joint: Secondary | ICD-10-CM | POA: Diagnosis not present

## 2015-08-07 DIAGNOSIS — E1142 Type 2 diabetes mellitus with diabetic polyneuropathy: Secondary | ICD-10-CM | POA: Diagnosis not present

## 2015-08-07 DIAGNOSIS — S42202D Unspecified fracture of upper end of left humerus, subsequent encounter for fracture with routine healing: Secondary | ICD-10-CM | POA: Diagnosis not present

## 2015-08-07 DIAGNOSIS — Z86718 Personal history of other venous thrombosis and embolism: Secondary | ICD-10-CM | POA: Diagnosis not present

## 2015-08-07 DIAGNOSIS — I10 Essential (primary) hypertension: Secondary | ICD-10-CM | POA: Diagnosis not present

## 2015-08-07 DIAGNOSIS — W19XXXD Unspecified fall, subsequent encounter: Secondary | ICD-10-CM | POA: Diagnosis not present

## 2015-08-07 DIAGNOSIS — Z96642 Presence of left artificial hip joint: Secondary | ICD-10-CM | POA: Diagnosis not present

## 2015-08-09 DIAGNOSIS — Z96642 Presence of left artificial hip joint: Secondary | ICD-10-CM | POA: Diagnosis not present

## 2015-08-09 DIAGNOSIS — W19XXXD Unspecified fall, subsequent encounter: Secondary | ICD-10-CM | POA: Diagnosis not present

## 2015-08-09 DIAGNOSIS — E1142 Type 2 diabetes mellitus with diabetic polyneuropathy: Secondary | ICD-10-CM | POA: Diagnosis not present

## 2015-08-09 DIAGNOSIS — I10 Essential (primary) hypertension: Secondary | ICD-10-CM | POA: Diagnosis not present

## 2015-08-09 DIAGNOSIS — S42202D Unspecified fracture of upper end of left humerus, subsequent encounter for fracture with routine healing: Secondary | ICD-10-CM | POA: Diagnosis not present

## 2015-08-09 DIAGNOSIS — Z86718 Personal history of other venous thrombosis and embolism: Secondary | ICD-10-CM | POA: Diagnosis not present

## 2015-08-10 DIAGNOSIS — E1142 Type 2 diabetes mellitus with diabetic polyneuropathy: Secondary | ICD-10-CM | POA: Diagnosis not present

## 2015-08-10 DIAGNOSIS — I10 Essential (primary) hypertension: Secondary | ICD-10-CM | POA: Diagnosis not present

## 2015-08-10 DIAGNOSIS — S42202D Unspecified fracture of upper end of left humerus, subsequent encounter for fracture with routine healing: Secondary | ICD-10-CM | POA: Diagnosis not present

## 2015-08-10 DIAGNOSIS — Z86718 Personal history of other venous thrombosis and embolism: Secondary | ICD-10-CM | POA: Diagnosis not present

## 2015-08-10 DIAGNOSIS — Z96642 Presence of left artificial hip joint: Secondary | ICD-10-CM | POA: Diagnosis not present

## 2015-08-10 DIAGNOSIS — W19XXXD Unspecified fall, subsequent encounter: Secondary | ICD-10-CM | POA: Diagnosis not present

## 2015-08-13 DIAGNOSIS — Z96642 Presence of left artificial hip joint: Secondary | ICD-10-CM | POA: Diagnosis not present

## 2015-08-13 DIAGNOSIS — E1142 Type 2 diabetes mellitus with diabetic polyneuropathy: Secondary | ICD-10-CM | POA: Diagnosis not present

## 2015-08-13 DIAGNOSIS — I10 Essential (primary) hypertension: Secondary | ICD-10-CM | POA: Diagnosis not present

## 2015-08-13 DIAGNOSIS — S42202D Unspecified fracture of upper end of left humerus, subsequent encounter for fracture with routine healing: Secondary | ICD-10-CM | POA: Diagnosis not present

## 2015-08-13 DIAGNOSIS — Z86718 Personal history of other venous thrombosis and embolism: Secondary | ICD-10-CM | POA: Diagnosis not present

## 2015-08-13 DIAGNOSIS — W19XXXD Unspecified fall, subsequent encounter: Secondary | ICD-10-CM | POA: Diagnosis not present

## 2015-08-14 DIAGNOSIS — I10 Essential (primary) hypertension: Secondary | ICD-10-CM | POA: Diagnosis not present

## 2015-08-14 DIAGNOSIS — W19XXXD Unspecified fall, subsequent encounter: Secondary | ICD-10-CM | POA: Diagnosis not present

## 2015-08-14 DIAGNOSIS — Z96642 Presence of left artificial hip joint: Secondary | ICD-10-CM | POA: Diagnosis not present

## 2015-08-14 DIAGNOSIS — Z86718 Personal history of other venous thrombosis and embolism: Secondary | ICD-10-CM | POA: Diagnosis not present

## 2015-08-14 DIAGNOSIS — E1142 Type 2 diabetes mellitus with diabetic polyneuropathy: Secondary | ICD-10-CM | POA: Diagnosis not present

## 2015-08-14 DIAGNOSIS — S42202D Unspecified fracture of upper end of left humerus, subsequent encounter for fracture with routine healing: Secondary | ICD-10-CM | POA: Diagnosis not present

## 2015-08-16 DIAGNOSIS — W19XXXD Unspecified fall, subsequent encounter: Secondary | ICD-10-CM | POA: Diagnosis not present

## 2015-08-16 DIAGNOSIS — Z86718 Personal history of other venous thrombosis and embolism: Secondary | ICD-10-CM | POA: Diagnosis not present

## 2015-08-16 DIAGNOSIS — I10 Essential (primary) hypertension: Secondary | ICD-10-CM | POA: Diagnosis not present

## 2015-08-16 DIAGNOSIS — S42202D Unspecified fracture of upper end of left humerus, subsequent encounter for fracture with routine healing: Secondary | ICD-10-CM | POA: Diagnosis not present

## 2015-08-16 DIAGNOSIS — E1142 Type 2 diabetes mellitus with diabetic polyneuropathy: Secondary | ICD-10-CM | POA: Diagnosis not present

## 2015-08-16 DIAGNOSIS — Z96642 Presence of left artificial hip joint: Secondary | ICD-10-CM | POA: Diagnosis not present

## 2015-08-20 DIAGNOSIS — S42202D Unspecified fracture of upper end of left humerus, subsequent encounter for fracture with routine healing: Secondary | ICD-10-CM | POA: Diagnosis not present

## 2015-08-20 DIAGNOSIS — Z86718 Personal history of other venous thrombosis and embolism: Secondary | ICD-10-CM | POA: Diagnosis not present

## 2015-08-20 DIAGNOSIS — E1142 Type 2 diabetes mellitus with diabetic polyneuropathy: Secondary | ICD-10-CM | POA: Diagnosis not present

## 2015-08-20 DIAGNOSIS — Z96642 Presence of left artificial hip joint: Secondary | ICD-10-CM | POA: Diagnosis not present

## 2015-08-20 DIAGNOSIS — I10 Essential (primary) hypertension: Secondary | ICD-10-CM | POA: Diagnosis not present

## 2015-08-20 DIAGNOSIS — W19XXXD Unspecified fall, subsequent encounter: Secondary | ICD-10-CM | POA: Diagnosis not present

## 2015-08-21 DIAGNOSIS — W19XXXD Unspecified fall, subsequent encounter: Secondary | ICD-10-CM | POA: Diagnosis not present

## 2015-08-21 DIAGNOSIS — S42202D Unspecified fracture of upper end of left humerus, subsequent encounter for fracture with routine healing: Secondary | ICD-10-CM | POA: Diagnosis not present

## 2015-08-21 DIAGNOSIS — I10 Essential (primary) hypertension: Secondary | ICD-10-CM | POA: Diagnosis not present

## 2015-08-21 DIAGNOSIS — Z96642 Presence of left artificial hip joint: Secondary | ICD-10-CM | POA: Diagnosis not present

## 2015-08-21 DIAGNOSIS — E1142 Type 2 diabetes mellitus with diabetic polyneuropathy: Secondary | ICD-10-CM | POA: Diagnosis not present

## 2015-08-21 DIAGNOSIS — Z86718 Personal history of other venous thrombosis and embolism: Secondary | ICD-10-CM | POA: Diagnosis not present

## 2015-08-23 DIAGNOSIS — Z96642 Presence of left artificial hip joint: Secondary | ICD-10-CM | POA: Diagnosis not present

## 2015-08-23 DIAGNOSIS — S42202D Unspecified fracture of upper end of left humerus, subsequent encounter for fracture with routine healing: Secondary | ICD-10-CM | POA: Diagnosis not present

## 2015-08-23 DIAGNOSIS — W19XXXD Unspecified fall, subsequent encounter: Secondary | ICD-10-CM | POA: Diagnosis not present

## 2015-08-23 DIAGNOSIS — Z86718 Personal history of other venous thrombosis and embolism: Secondary | ICD-10-CM | POA: Diagnosis not present

## 2015-08-23 DIAGNOSIS — E1142 Type 2 diabetes mellitus with diabetic polyneuropathy: Secondary | ICD-10-CM | POA: Diagnosis not present

## 2015-08-23 DIAGNOSIS — I10 Essential (primary) hypertension: Secondary | ICD-10-CM | POA: Diagnosis not present

## 2015-08-27 DIAGNOSIS — Z86718 Personal history of other venous thrombosis and embolism: Secondary | ICD-10-CM | POA: Diagnosis not present

## 2015-08-27 DIAGNOSIS — I10 Essential (primary) hypertension: Secondary | ICD-10-CM | POA: Diagnosis not present

## 2015-08-27 DIAGNOSIS — W19XXXD Unspecified fall, subsequent encounter: Secondary | ICD-10-CM | POA: Diagnosis not present

## 2015-08-27 DIAGNOSIS — E1142 Type 2 diabetes mellitus with diabetic polyneuropathy: Secondary | ICD-10-CM | POA: Diagnosis not present

## 2015-08-27 DIAGNOSIS — Z96642 Presence of left artificial hip joint: Secondary | ICD-10-CM | POA: Diagnosis not present

## 2015-08-27 DIAGNOSIS — S42202D Unspecified fracture of upper end of left humerus, subsequent encounter for fracture with routine healing: Secondary | ICD-10-CM | POA: Diagnosis not present

## 2015-08-29 DIAGNOSIS — W19XXXD Unspecified fall, subsequent encounter: Secondary | ICD-10-CM | POA: Diagnosis not present

## 2015-08-29 DIAGNOSIS — Z96642 Presence of left artificial hip joint: Secondary | ICD-10-CM | POA: Diagnosis not present

## 2015-08-29 DIAGNOSIS — Z86718 Personal history of other venous thrombosis and embolism: Secondary | ICD-10-CM | POA: Diagnosis not present

## 2015-08-29 DIAGNOSIS — I10 Essential (primary) hypertension: Secondary | ICD-10-CM | POA: Diagnosis not present

## 2015-08-29 DIAGNOSIS — S42202D Unspecified fracture of upper end of left humerus, subsequent encounter for fracture with routine healing: Secondary | ICD-10-CM | POA: Diagnosis not present

## 2015-08-29 DIAGNOSIS — E1142 Type 2 diabetes mellitus with diabetic polyneuropathy: Secondary | ICD-10-CM | POA: Diagnosis not present

## 2015-09-03 DIAGNOSIS — S42202D Unspecified fracture of upper end of left humerus, subsequent encounter for fracture with routine healing: Secondary | ICD-10-CM | POA: Diagnosis not present

## 2015-09-03 DIAGNOSIS — E1142 Type 2 diabetes mellitus with diabetic polyneuropathy: Secondary | ICD-10-CM | POA: Diagnosis not present

## 2015-09-03 DIAGNOSIS — I10 Essential (primary) hypertension: Secondary | ICD-10-CM | POA: Diagnosis not present

## 2015-09-03 DIAGNOSIS — W19XXXD Unspecified fall, subsequent encounter: Secondary | ICD-10-CM | POA: Diagnosis not present

## 2015-09-03 DIAGNOSIS — Z96642 Presence of left artificial hip joint: Secondary | ICD-10-CM | POA: Diagnosis not present

## 2015-09-03 DIAGNOSIS — Z86718 Personal history of other venous thrombosis and embolism: Secondary | ICD-10-CM | POA: Diagnosis not present

## 2015-09-05 DIAGNOSIS — S42202D Unspecified fracture of upper end of left humerus, subsequent encounter for fracture with routine healing: Secondary | ICD-10-CM | POA: Diagnosis not present

## 2015-09-05 DIAGNOSIS — Z96642 Presence of left artificial hip joint: Secondary | ICD-10-CM | POA: Diagnosis not present

## 2015-09-05 DIAGNOSIS — Z86718 Personal history of other venous thrombosis and embolism: Secondary | ICD-10-CM | POA: Diagnosis not present

## 2015-09-05 DIAGNOSIS — W19XXXD Unspecified fall, subsequent encounter: Secondary | ICD-10-CM | POA: Diagnosis not present

## 2015-09-05 DIAGNOSIS — E1142 Type 2 diabetes mellitus with diabetic polyneuropathy: Secondary | ICD-10-CM | POA: Diagnosis not present

## 2015-09-05 DIAGNOSIS — M25512 Pain in left shoulder: Secondary | ICD-10-CM | POA: Diagnosis not present

## 2015-09-05 DIAGNOSIS — I10 Essential (primary) hypertension: Secondary | ICD-10-CM | POA: Diagnosis not present

## 2015-09-06 DIAGNOSIS — N952 Postmenopausal atrophic vaginitis: Secondary | ICD-10-CM | POA: Diagnosis not present

## 2015-09-06 DIAGNOSIS — N3090 Cystitis, unspecified without hematuria: Secondary | ICD-10-CM | POA: Diagnosis not present

## 2015-09-11 DIAGNOSIS — R1011 Right upper quadrant pain: Secondary | ICD-10-CM | POA: Diagnosis not present

## 2015-09-12 DIAGNOSIS — K7689 Other specified diseases of liver: Secondary | ICD-10-CM | POA: Diagnosis not present

## 2015-09-12 DIAGNOSIS — R1011 Right upper quadrant pain: Secondary | ICD-10-CM | POA: Diagnosis not present

## 2015-09-13 DIAGNOSIS — I10 Essential (primary) hypertension: Secondary | ICD-10-CM | POA: Diagnosis not present

## 2015-09-13 DIAGNOSIS — W19XXXD Unspecified fall, subsequent encounter: Secondary | ICD-10-CM | POA: Diagnosis not present

## 2015-09-13 DIAGNOSIS — S42202D Unspecified fracture of upper end of left humerus, subsequent encounter for fracture with routine healing: Secondary | ICD-10-CM | POA: Diagnosis not present

## 2015-09-13 DIAGNOSIS — E1142 Type 2 diabetes mellitus with diabetic polyneuropathy: Secondary | ICD-10-CM | POA: Diagnosis not present

## 2015-09-13 DIAGNOSIS — Z86718 Personal history of other venous thrombosis and embolism: Secondary | ICD-10-CM | POA: Diagnosis not present

## 2015-09-13 DIAGNOSIS — Z96642 Presence of left artificial hip joint: Secondary | ICD-10-CM | POA: Diagnosis not present

## 2015-09-14 DIAGNOSIS — S42202D Unspecified fracture of upper end of left humerus, subsequent encounter for fracture with routine healing: Secondary | ICD-10-CM | POA: Diagnosis not present

## 2015-09-14 DIAGNOSIS — Z96642 Presence of left artificial hip joint: Secondary | ICD-10-CM | POA: Diagnosis not present

## 2015-09-14 DIAGNOSIS — Z86718 Personal history of other venous thrombosis and embolism: Secondary | ICD-10-CM | POA: Diagnosis not present

## 2015-09-14 DIAGNOSIS — W19XXXD Unspecified fall, subsequent encounter: Secondary | ICD-10-CM | POA: Diagnosis not present

## 2015-09-14 DIAGNOSIS — I10 Essential (primary) hypertension: Secondary | ICD-10-CM | POA: Diagnosis not present

## 2015-09-14 DIAGNOSIS — E1142 Type 2 diabetes mellitus with diabetic polyneuropathy: Secondary | ICD-10-CM | POA: Diagnosis not present

## 2015-09-17 DIAGNOSIS — W19XXXD Unspecified fall, subsequent encounter: Secondary | ICD-10-CM | POA: Diagnosis not present

## 2015-09-17 DIAGNOSIS — I10 Essential (primary) hypertension: Secondary | ICD-10-CM | POA: Diagnosis not present

## 2015-09-17 DIAGNOSIS — S42202D Unspecified fracture of upper end of left humerus, subsequent encounter for fracture with routine healing: Secondary | ICD-10-CM | POA: Diagnosis not present

## 2015-09-17 DIAGNOSIS — E1142 Type 2 diabetes mellitus with diabetic polyneuropathy: Secondary | ICD-10-CM | POA: Diagnosis not present

## 2015-09-17 DIAGNOSIS — Z96642 Presence of left artificial hip joint: Secondary | ICD-10-CM | POA: Diagnosis not present

## 2015-09-17 DIAGNOSIS — Z86718 Personal history of other venous thrombosis and embolism: Secondary | ICD-10-CM | POA: Diagnosis not present

## 2015-09-19 DIAGNOSIS — E1142 Type 2 diabetes mellitus with diabetic polyneuropathy: Secondary | ICD-10-CM | POA: Diagnosis not present

## 2015-09-19 DIAGNOSIS — W19XXXD Unspecified fall, subsequent encounter: Secondary | ICD-10-CM | POA: Diagnosis not present

## 2015-09-19 DIAGNOSIS — S42202D Unspecified fracture of upper end of left humerus, subsequent encounter for fracture with routine healing: Secondary | ICD-10-CM | POA: Diagnosis not present

## 2015-09-19 DIAGNOSIS — Z86718 Personal history of other venous thrombosis and embolism: Secondary | ICD-10-CM | POA: Diagnosis not present

## 2015-09-19 DIAGNOSIS — I10 Essential (primary) hypertension: Secondary | ICD-10-CM | POA: Diagnosis not present

## 2015-09-19 DIAGNOSIS — Z96642 Presence of left artificial hip joint: Secondary | ICD-10-CM | POA: Diagnosis not present

## 2015-09-25 DIAGNOSIS — S42202D Unspecified fracture of upper end of left humerus, subsequent encounter for fracture with routine healing: Secondary | ICD-10-CM | POA: Diagnosis not present

## 2015-09-26 DIAGNOSIS — S42202D Unspecified fracture of upper end of left humerus, subsequent encounter for fracture with routine healing: Secondary | ICD-10-CM | POA: Diagnosis not present

## 2015-09-28 DIAGNOSIS — S42202D Unspecified fracture of upper end of left humerus, subsequent encounter for fracture with routine healing: Secondary | ICD-10-CM | POA: Diagnosis not present

## 2015-10-25 DIAGNOSIS — M810 Age-related osteoporosis without current pathological fracture: Secondary | ICD-10-CM | POA: Diagnosis not present

## 2015-10-25 DIAGNOSIS — I77810 Thoracic aortic ectasia: Secondary | ICD-10-CM | POA: Diagnosis not present

## 2015-10-25 DIAGNOSIS — E119 Type 2 diabetes mellitus without complications: Secondary | ICD-10-CM | POA: Diagnosis not present

## 2015-10-25 DIAGNOSIS — I10 Essential (primary) hypertension: Secondary | ICD-10-CM | POA: Diagnosis not present

## 2015-10-25 DIAGNOSIS — Z0000 Encounter for general adult medical examination without abnormal findings: Secondary | ICD-10-CM | POA: Diagnosis not present

## 2015-10-26 DIAGNOSIS — R300 Dysuria: Secondary | ICD-10-CM | POA: Diagnosis not present

## 2015-10-26 DIAGNOSIS — N3000 Acute cystitis without hematuria: Secondary | ICD-10-CM | POA: Diagnosis not present

## 2015-10-26 DIAGNOSIS — E119 Type 2 diabetes mellitus without complications: Secondary | ICD-10-CM | POA: Diagnosis not present

## 2015-10-31 IMAGING — CR DG PORTABLE PELVIS
1 series · 1 of 1 positions shown · non-contrast
Comparison: None.

CLINICAL DATA: Bipolar LEFT hip hemiarthroplasty placement.

EXAM:
PORTABLE PELVIS 1-2 VIEWS

[AP]
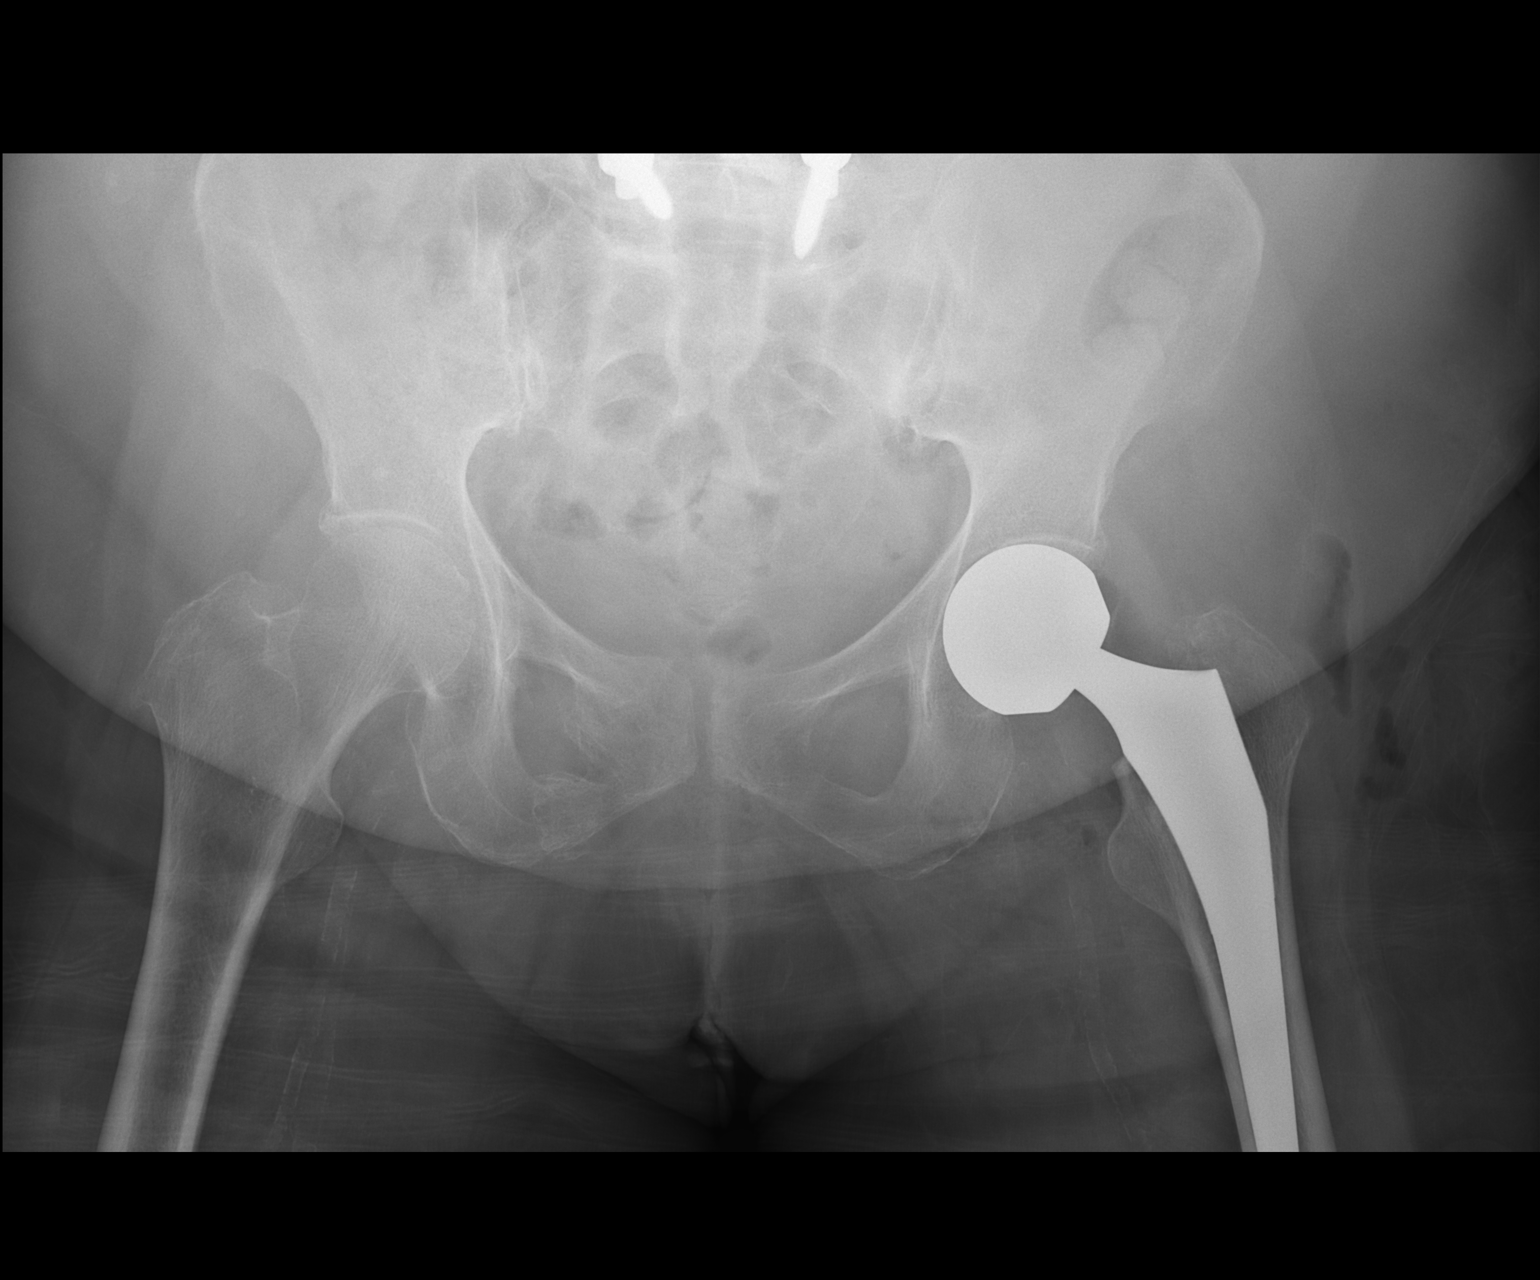

[1 of 1 positions shown; findings below may reference images not displayed]

FINDINGS: Single frontal view of the pelvis demonstrates a new LEFT bipolar
hip hemiarthroplasty. Old RIGHT obturator ring fractures are
present. Atherosclerosis. Lumbar PLIF. No complicating features.
Distal femoral stem visible and normal. No lateral view is
submitted. Expected postsurgical changes in soft tissues.
IMPRESSION: Uncomplicated new LEFT bipolar hip hemiarthroplasty.

## 2015-11-01 ENCOUNTER — Ambulatory Visit: Payer: Medicare Other | Admitting: Sports Medicine

## 2015-11-06 ENCOUNTER — Other Ambulatory Visit: Payer: Self-pay

## 2016-01-04 DIAGNOSIS — H401121 Primary open-angle glaucoma, left eye, mild stage: Secondary | ICD-10-CM | POA: Diagnosis not present

## 2016-01-04 DIAGNOSIS — G629 Polyneuropathy, unspecified: Secondary | ICD-10-CM | POA: Diagnosis not present

## 2016-04-11 DIAGNOSIS — Z9181 History of falling: Secondary | ICD-10-CM | POA: Diagnosis not present

## 2016-04-11 DIAGNOSIS — Z1389 Encounter for screening for other disorder: Secondary | ICD-10-CM | POA: Diagnosis not present

## 2016-04-11 DIAGNOSIS — E119 Type 2 diabetes mellitus without complications: Secondary | ICD-10-CM | POA: Diagnosis not present

## 2016-04-11 DIAGNOSIS — G629 Polyneuropathy, unspecified: Secondary | ICD-10-CM | POA: Diagnosis not present

## 2016-04-28 DIAGNOSIS — M810 Age-related osteoporosis without current pathological fracture: Secondary | ICD-10-CM | POA: Diagnosis not present

## 2016-04-28 DIAGNOSIS — J82 Pulmonary eosinophilia, not elsewhere classified: Secondary | ICD-10-CM | POA: Diagnosis not present

## 2016-04-28 DIAGNOSIS — Z79899 Other long term (current) drug therapy: Secondary | ICD-10-CM | POA: Diagnosis not present

## 2016-04-28 DIAGNOSIS — E119 Type 2 diabetes mellitus without complications: Secondary | ICD-10-CM | POA: Diagnosis not present

## 2016-04-28 DIAGNOSIS — G629 Polyneuropathy, unspecified: Secondary | ICD-10-CM | POA: Diagnosis not present

## 2016-04-28 DIAGNOSIS — E559 Vitamin D deficiency, unspecified: Secondary | ICD-10-CM | POA: Diagnosis not present

## 2016-06-02 DIAGNOSIS — I5033 Acute on chronic diastolic (congestive) heart failure: Secondary | ICD-10-CM | POA: Diagnosis present

## 2016-06-02 DIAGNOSIS — I110 Hypertensive heart disease with heart failure: Secondary | ICD-10-CM | POA: Diagnosis not present

## 2016-06-02 DIAGNOSIS — I361 Nonrheumatic tricuspid (valve) insufficiency: Secondary | ICD-10-CM | POA: Diagnosis not present

## 2016-06-02 DIAGNOSIS — N390 Urinary tract infection, site not specified: Secondary | ICD-10-CM | POA: Diagnosis not present

## 2016-06-02 DIAGNOSIS — B9620 Unspecified Escherichia coli [E. coli] as the cause of diseases classified elsewhere: Secondary | ICD-10-CM | POA: Diagnosis not present

## 2016-06-02 DIAGNOSIS — Z7401 Bed confinement status: Secondary | ICD-10-CM | POA: Diagnosis not present

## 2016-06-02 DIAGNOSIS — A419 Sepsis, unspecified organism: Secondary | ICD-10-CM | POA: Diagnosis not present

## 2016-06-02 DIAGNOSIS — R300 Dysuria: Secondary | ICD-10-CM | POA: Diagnosis not present

## 2016-06-02 DIAGNOSIS — F329 Major depressive disorder, single episode, unspecified: Secondary | ICD-10-CM | POA: Diagnosis not present

## 2016-06-02 DIAGNOSIS — L97909 Non-pressure chronic ulcer of unspecified part of unspecified lower leg with unspecified severity: Secondary | ICD-10-CM | POA: Diagnosis present

## 2016-06-02 DIAGNOSIS — B952 Enterococcus as the cause of diseases classified elsewhere: Secondary | ICD-10-CM | POA: Diagnosis not present

## 2016-06-02 DIAGNOSIS — W19XXXA Unspecified fall, initial encounter: Secondary | ICD-10-CM | POA: Diagnosis not present

## 2016-06-02 DIAGNOSIS — Z23 Encounter for immunization: Secondary | ICD-10-CM | POA: Diagnosis not present

## 2016-06-02 DIAGNOSIS — G629 Polyneuropathy, unspecified: Secondary | ICD-10-CM | POA: Diagnosis present

## 2016-06-02 DIAGNOSIS — Z880 Allergy status to penicillin: Secondary | ICD-10-CM | POA: Diagnosis not present

## 2016-06-02 DIAGNOSIS — A4151 Sepsis due to Escherichia coli [E. coli]: Secondary | ICD-10-CM | POA: Diagnosis not present

## 2016-06-02 DIAGNOSIS — I509 Heart failure, unspecified: Secondary | ICD-10-CM | POA: Diagnosis not present

## 2016-06-02 DIAGNOSIS — T148XXA Other injury of unspecified body region, initial encounter: Secondary | ICD-10-CM | POA: Diagnosis not present

## 2016-06-02 DIAGNOSIS — M19011 Primary osteoarthritis, right shoulder: Secondary | ICD-10-CM | POA: Diagnosis not present

## 2016-06-02 DIAGNOSIS — S4992XA Unspecified injury of left shoulder and upper arm, initial encounter: Secondary | ICD-10-CM | POA: Diagnosis not present

## 2016-06-02 DIAGNOSIS — I340 Nonrheumatic mitral (valve) insufficiency: Secondary | ICD-10-CM | POA: Diagnosis not present

## 2016-06-02 DIAGNOSIS — W19XXXD Unspecified fall, subsequent encounter: Secondary | ICD-10-CM | POA: Diagnosis not present

## 2016-06-02 DIAGNOSIS — R279 Unspecified lack of coordination: Secondary | ICD-10-CM | POA: Diagnosis not present

## 2016-06-02 DIAGNOSIS — R0902 Hypoxemia: Secondary | ICD-10-CM | POA: Diagnosis not present

## 2016-06-02 DIAGNOSIS — S299XXA Unspecified injury of thorax, initial encounter: Secondary | ICD-10-CM | POA: Diagnosis not present

## 2016-06-02 DIAGNOSIS — R2242 Localized swelling, mass and lump, left lower limb: Secondary | ICD-10-CM | POA: Diagnosis not present

## 2016-06-02 DIAGNOSIS — N3001 Acute cystitis with hematuria: Secondary | ICD-10-CM | POA: Diagnosis not present

## 2016-06-02 DIAGNOSIS — L89151 Pressure ulcer of sacral region, stage 1: Secondary | ICD-10-CM | POA: Diagnosis not present

## 2016-06-02 DIAGNOSIS — Z79899 Other long term (current) drug therapy: Secondary | ICD-10-CM | POA: Diagnosis not present

## 2016-06-02 DIAGNOSIS — A4181 Sepsis due to Enterococcus: Secondary | ICD-10-CM | POA: Diagnosis present

## 2016-06-02 DIAGNOSIS — S3993XA Unspecified injury of pelvis, initial encounter: Secondary | ICD-10-CM | POA: Diagnosis not present

## 2016-06-02 DIAGNOSIS — Z7982 Long term (current) use of aspirin: Secondary | ICD-10-CM | POA: Diagnosis not present

## 2016-06-03 DIAGNOSIS — N390 Urinary tract infection, site not specified: Secondary | ICD-10-CM

## 2016-06-03 DIAGNOSIS — I509 Heart failure, unspecified: Secondary | ICD-10-CM

## 2016-06-03 DIAGNOSIS — W19XXXA Unspecified fall, initial encounter: Secondary | ICD-10-CM

## 2016-06-03 DIAGNOSIS — R0902 Hypoxemia: Secondary | ICD-10-CM

## 2016-06-06 DIAGNOSIS — Z880 Allergy status to penicillin: Secondary | ICD-10-CM | POA: Diagnosis not present

## 2016-06-06 DIAGNOSIS — D649 Anemia, unspecified: Secondary | ICD-10-CM | POA: Diagnosis not present

## 2016-06-06 DIAGNOSIS — I110 Hypertensive heart disease with heart failure: Secondary | ICD-10-CM | POA: Diagnosis not present

## 2016-06-06 DIAGNOSIS — N3001 Acute cystitis with hematuria: Secondary | ICD-10-CM | POA: Diagnosis not present

## 2016-06-06 DIAGNOSIS — Z7401 Bed confinement status: Secondary | ICD-10-CM | POA: Diagnosis not present

## 2016-06-06 DIAGNOSIS — W19XXXA Unspecified fall, initial encounter: Secondary | ICD-10-CM | POA: Diagnosis not present

## 2016-06-06 DIAGNOSIS — M19011 Primary osteoarthritis, right shoulder: Secondary | ICD-10-CM | POA: Diagnosis not present

## 2016-06-06 DIAGNOSIS — W19XXXD Unspecified fall, subsequent encounter: Secondary | ICD-10-CM | POA: Diagnosis not present

## 2016-06-06 DIAGNOSIS — Z7982 Long term (current) use of aspirin: Secondary | ICD-10-CM | POA: Diagnosis not present

## 2016-06-06 DIAGNOSIS — L97909 Non-pressure chronic ulcer of unspecified part of unspecified lower leg with unspecified severity: Secondary | ICD-10-CM | POA: Diagnosis not present

## 2016-06-06 DIAGNOSIS — I509 Heart failure, unspecified: Secondary | ICD-10-CM | POA: Diagnosis not present

## 2016-06-06 DIAGNOSIS — N390 Urinary tract infection, site not specified: Secondary | ICD-10-CM | POA: Diagnosis not present

## 2016-06-06 DIAGNOSIS — B9620 Unspecified Escherichia coli [E. coli] as the cause of diseases classified elsewhere: Secondary | ICD-10-CM | POA: Diagnosis not present

## 2016-06-06 DIAGNOSIS — F329 Major depressive disorder, single episode, unspecified: Secondary | ICD-10-CM | POA: Diagnosis not present

## 2016-06-06 DIAGNOSIS — R0902 Hypoxemia: Secondary | ICD-10-CM | POA: Diagnosis not present

## 2016-06-06 DIAGNOSIS — I5033 Acute on chronic diastolic (congestive) heart failure: Secondary | ICD-10-CM | POA: Diagnosis not present

## 2016-06-06 DIAGNOSIS — B952 Enterococcus as the cause of diseases classified elsewhere: Secondary | ICD-10-CM | POA: Diagnosis not present

## 2016-06-06 DIAGNOSIS — Z23 Encounter for immunization: Secondary | ICD-10-CM | POA: Diagnosis not present

## 2016-06-06 DIAGNOSIS — Z79899 Other long term (current) drug therapy: Secondary | ICD-10-CM | POA: Diagnosis not present

## 2016-06-06 DIAGNOSIS — R279 Unspecified lack of coordination: Secondary | ICD-10-CM | POA: Diagnosis not present

## 2016-06-06 DIAGNOSIS — G629 Polyneuropathy, unspecified: Secondary | ICD-10-CM | POA: Diagnosis not present

## 2016-06-06 DIAGNOSIS — A4181 Sepsis due to Enterococcus: Secondary | ICD-10-CM | POA: Diagnosis not present

## 2016-06-06 DIAGNOSIS — R262 Difficulty in walking, not elsewhere classified: Secondary | ICD-10-CM | POA: Diagnosis not present

## 2016-06-14 DIAGNOSIS — R262 Difficulty in walking, not elsewhere classified: Secondary | ICD-10-CM | POA: Diagnosis not present

## 2016-06-14 DIAGNOSIS — I509 Heart failure, unspecified: Secondary | ICD-10-CM | POA: Diagnosis not present

## 2016-06-14 DIAGNOSIS — D649 Anemia, unspecified: Secondary | ICD-10-CM | POA: Diagnosis not present

## 2016-06-14 DIAGNOSIS — N390 Urinary tract infection, site not specified: Secondary | ICD-10-CM | POA: Diagnosis not present

## 2016-06-27 DIAGNOSIS — S42202A Unspecified fracture of upper end of left humerus, initial encounter for closed fracture: Secondary | ICD-10-CM | POA: Diagnosis not present

## 2016-06-27 DIAGNOSIS — I110 Hypertensive heart disease with heart failure: Secondary | ICD-10-CM | POA: Diagnosis not present

## 2016-06-27 DIAGNOSIS — I509 Heart failure, unspecified: Secondary | ICD-10-CM | POA: Diagnosis not present

## 2016-06-27 DIAGNOSIS — L89612 Pressure ulcer of right heel, stage 2: Secondary | ICD-10-CM | POA: Diagnosis not present

## 2016-06-27 DIAGNOSIS — N390 Urinary tract infection, site not specified: Secondary | ICD-10-CM | POA: Diagnosis not present

## 2016-06-27 DIAGNOSIS — G603 Idiopathic progressive neuropathy: Secondary | ICD-10-CM | POA: Diagnosis not present

## 2016-06-30 DIAGNOSIS — S42202A Unspecified fracture of upper end of left humerus, initial encounter for closed fracture: Secondary | ICD-10-CM | POA: Diagnosis not present

## 2016-06-30 DIAGNOSIS — I509 Heart failure, unspecified: Secondary | ICD-10-CM | POA: Diagnosis not present

## 2016-06-30 DIAGNOSIS — I110 Hypertensive heart disease with heart failure: Secondary | ICD-10-CM | POA: Diagnosis not present

## 2016-06-30 DIAGNOSIS — G603 Idiopathic progressive neuropathy: Secondary | ICD-10-CM | POA: Diagnosis not present

## 2016-06-30 DIAGNOSIS — L89612 Pressure ulcer of right heel, stage 2: Secondary | ICD-10-CM | POA: Diagnosis not present

## 2016-06-30 DIAGNOSIS — N390 Urinary tract infection, site not specified: Secondary | ICD-10-CM | POA: Diagnosis not present

## 2016-07-01 DIAGNOSIS — I509 Heart failure, unspecified: Secondary | ICD-10-CM | POA: Diagnosis not present

## 2016-07-01 DIAGNOSIS — G603 Idiopathic progressive neuropathy: Secondary | ICD-10-CM | POA: Diagnosis not present

## 2016-07-01 DIAGNOSIS — S42202A Unspecified fracture of upper end of left humerus, initial encounter for closed fracture: Secondary | ICD-10-CM | POA: Diagnosis not present

## 2016-07-01 DIAGNOSIS — N390 Urinary tract infection, site not specified: Secondary | ICD-10-CM | POA: Diagnosis not present

## 2016-07-01 DIAGNOSIS — L89612 Pressure ulcer of right heel, stage 2: Secondary | ICD-10-CM | POA: Diagnosis not present

## 2016-07-01 DIAGNOSIS — I110 Hypertensive heart disease with heart failure: Secondary | ICD-10-CM | POA: Diagnosis not present

## 2016-07-02 DIAGNOSIS — S42202A Unspecified fracture of upper end of left humerus, initial encounter for closed fracture: Secondary | ICD-10-CM | POA: Diagnosis not present

## 2016-07-02 DIAGNOSIS — I509 Heart failure, unspecified: Secondary | ICD-10-CM | POA: Diagnosis not present

## 2016-07-02 DIAGNOSIS — N390 Urinary tract infection, site not specified: Secondary | ICD-10-CM | POA: Diagnosis not present

## 2016-07-02 DIAGNOSIS — I110 Hypertensive heart disease with heart failure: Secondary | ICD-10-CM | POA: Diagnosis not present

## 2016-07-02 DIAGNOSIS — L89612 Pressure ulcer of right heel, stage 2: Secondary | ICD-10-CM | POA: Diagnosis not present

## 2016-07-02 DIAGNOSIS — G603 Idiopathic progressive neuropathy: Secondary | ICD-10-CM | POA: Diagnosis not present

## 2016-07-03 DIAGNOSIS — L89612 Pressure ulcer of right heel, stage 2: Secondary | ICD-10-CM | POA: Diagnosis not present

## 2016-07-03 DIAGNOSIS — G603 Idiopathic progressive neuropathy: Secondary | ICD-10-CM | POA: Diagnosis not present

## 2016-07-03 DIAGNOSIS — I110 Hypertensive heart disease with heart failure: Secondary | ICD-10-CM | POA: Diagnosis not present

## 2016-07-03 DIAGNOSIS — N390 Urinary tract infection, site not specified: Secondary | ICD-10-CM | POA: Diagnosis not present

## 2016-07-03 DIAGNOSIS — I509 Heart failure, unspecified: Secondary | ICD-10-CM | POA: Diagnosis not present

## 2016-07-03 DIAGNOSIS — S42202A Unspecified fracture of upper end of left humerus, initial encounter for closed fracture: Secondary | ICD-10-CM | POA: Diagnosis not present

## 2016-07-04 DIAGNOSIS — N390 Urinary tract infection, site not specified: Secondary | ICD-10-CM | POA: Diagnosis not present

## 2016-07-04 DIAGNOSIS — I509 Heart failure, unspecified: Secondary | ICD-10-CM | POA: Diagnosis not present

## 2016-07-04 DIAGNOSIS — I110 Hypertensive heart disease with heart failure: Secondary | ICD-10-CM | POA: Diagnosis not present

## 2016-07-04 DIAGNOSIS — L89612 Pressure ulcer of right heel, stage 2: Secondary | ICD-10-CM | POA: Diagnosis not present

## 2016-07-04 DIAGNOSIS — S42202A Unspecified fracture of upper end of left humerus, initial encounter for closed fracture: Secondary | ICD-10-CM | POA: Diagnosis not present

## 2016-07-04 DIAGNOSIS — G603 Idiopathic progressive neuropathy: Secondary | ICD-10-CM | POA: Diagnosis not present

## 2016-07-07 DIAGNOSIS — I110 Hypertensive heart disease with heart failure: Secondary | ICD-10-CM | POA: Diagnosis not present

## 2016-07-07 DIAGNOSIS — J810 Acute pulmonary edema: Secondary | ICD-10-CM | POA: Diagnosis not present

## 2016-07-07 DIAGNOSIS — Z7982 Long term (current) use of aspirin: Secondary | ICD-10-CM | POA: Diagnosis not present

## 2016-07-07 DIAGNOSIS — Z66 Do not resuscitate: Secondary | ICD-10-CM | POA: Diagnosis not present

## 2016-07-07 DIAGNOSIS — Z87440 Personal history of urinary (tract) infections: Secondary | ICD-10-CM | POA: Diagnosis not present

## 2016-07-07 DIAGNOSIS — N390 Urinary tract infection, site not specified: Secondary | ICD-10-CM | POA: Diagnosis not present

## 2016-07-07 DIAGNOSIS — K219 Gastro-esophageal reflux disease without esophagitis: Secondary | ICD-10-CM | POA: Diagnosis not present

## 2016-07-07 DIAGNOSIS — A4181 Sepsis due to Enterococcus: Secondary | ICD-10-CM | POA: Diagnosis not present

## 2016-07-07 DIAGNOSIS — L89619 Pressure ulcer of right heel, unspecified stage: Secondary | ICD-10-CM | POA: Diagnosis not present

## 2016-07-07 DIAGNOSIS — J9811 Atelectasis: Secondary | ICD-10-CM | POA: Diagnosis not present

## 2016-07-07 DIAGNOSIS — E119 Type 2 diabetes mellitus without complications: Secondary | ICD-10-CM | POA: Diagnosis present

## 2016-07-07 DIAGNOSIS — Z880 Allergy status to penicillin: Secondary | ICD-10-CM | POA: Diagnosis not present

## 2016-07-07 DIAGNOSIS — L8992 Pressure ulcer of unspecified site, stage 2: Secondary | ICD-10-CM | POA: Diagnosis not present

## 2016-07-07 DIAGNOSIS — A4102 Sepsis due to Methicillin resistant Staphylococcus aureus: Secondary | ICD-10-CM | POA: Diagnosis not present

## 2016-07-07 DIAGNOSIS — L89152 Pressure ulcer of sacral region, stage 2: Secondary | ICD-10-CM | POA: Diagnosis present

## 2016-07-07 DIAGNOSIS — I10 Essential (primary) hypertension: Secondary | ICD-10-CM | POA: Diagnosis not present

## 2016-07-07 DIAGNOSIS — M1990 Unspecified osteoarthritis, unspecified site: Secondary | ICD-10-CM | POA: Diagnosis not present

## 2016-07-07 DIAGNOSIS — F419 Anxiety disorder, unspecified: Secondary | ICD-10-CM | POA: Diagnosis present

## 2016-07-07 DIAGNOSIS — J90 Pleural effusion, not elsewhere classified: Secondary | ICD-10-CM | POA: Diagnosis not present

## 2016-07-07 DIAGNOSIS — R6520 Severe sepsis without septic shock: Secondary | ICD-10-CM | POA: Diagnosis not present

## 2016-07-07 DIAGNOSIS — N3001 Acute cystitis with hematuria: Secondary | ICD-10-CM | POA: Diagnosis not present

## 2016-07-07 DIAGNOSIS — A419 Sepsis, unspecified organism: Secondary | ICD-10-CM | POA: Diagnosis not present

## 2016-07-07 DIAGNOSIS — M549 Dorsalgia, unspecified: Secondary | ICD-10-CM | POA: Diagnosis not present

## 2016-07-07 DIAGNOSIS — R509 Fever, unspecified: Secondary | ICD-10-CM | POA: Diagnosis not present

## 2016-07-07 DIAGNOSIS — J9801 Acute bronchospasm: Secondary | ICD-10-CM | POA: Diagnosis not present

## 2016-07-07 DIAGNOSIS — R531 Weakness: Secondary | ICD-10-CM | POA: Diagnosis not present

## 2016-07-07 DIAGNOSIS — Z79899 Other long term (current) drug therapy: Secondary | ICD-10-CM | POA: Diagnosis not present

## 2016-07-07 DIAGNOSIS — G9340 Encephalopathy, unspecified: Secondary | ICD-10-CM | POA: Diagnosis not present

## 2016-07-10 DIAGNOSIS — J9801 Acute bronchospasm: Secondary | ICD-10-CM

## 2016-07-14 DIAGNOSIS — J9801 Acute bronchospasm: Secondary | ICD-10-CM | POA: Diagnosis not present

## 2016-07-14 DIAGNOSIS — A419 Sepsis, unspecified organism: Secondary | ICD-10-CM | POA: Diagnosis not present

## 2016-07-14 DIAGNOSIS — N390 Urinary tract infection, site not specified: Secondary | ICD-10-CM | POA: Diagnosis not present

## 2016-07-14 DIAGNOSIS — M439 Deforming dorsopathy, unspecified: Secondary | ICD-10-CM | POA: Diagnosis not present

## 2016-07-14 DIAGNOSIS — M1990 Unspecified osteoarthritis, unspecified site: Secondary | ICD-10-CM | POA: Diagnosis not present

## 2016-07-14 DIAGNOSIS — I10 Essential (primary) hypertension: Secondary | ICD-10-CM | POA: Diagnosis not present

## 2016-07-14 DIAGNOSIS — I119 Hypertensive heart disease without heart failure: Secondary | ICD-10-CM | POA: Diagnosis not present

## 2016-07-14 DIAGNOSIS — R531 Weakness: Secondary | ICD-10-CM | POA: Diagnosis not present

## 2016-07-14 DIAGNOSIS — L89619 Pressure ulcer of right heel, unspecified stage: Secondary | ICD-10-CM | POA: Diagnosis not present

## 2016-07-14 DIAGNOSIS — L8992 Pressure ulcer of unspecified site, stage 2: Secondary | ICD-10-CM | POA: Diagnosis not present

## 2016-07-14 DIAGNOSIS — R262 Difficulty in walking, not elsewhere classified: Secondary | ICD-10-CM | POA: Diagnosis not present

## 2016-07-14 DIAGNOSIS — R6520 Severe sepsis without septic shock: Secondary | ICD-10-CM | POA: Diagnosis not present

## 2016-07-21 DIAGNOSIS — M439 Deforming dorsopathy, unspecified: Secondary | ICD-10-CM | POA: Diagnosis not present

## 2016-07-21 DIAGNOSIS — I119 Hypertensive heart disease without heart failure: Secondary | ICD-10-CM | POA: Diagnosis not present

## 2016-07-21 DIAGNOSIS — R262 Difficulty in walking, not elsewhere classified: Secondary | ICD-10-CM | POA: Diagnosis not present

## 2016-07-21 DIAGNOSIS — N390 Urinary tract infection, site not specified: Secondary | ICD-10-CM | POA: Diagnosis not present

## 2016-07-31 DIAGNOSIS — I509 Heart failure, unspecified: Secondary | ICD-10-CM | POA: Diagnosis not present

## 2016-07-31 DIAGNOSIS — I110 Hypertensive heart disease with heart failure: Secondary | ICD-10-CM | POA: Diagnosis not present

## 2016-07-31 DIAGNOSIS — L89612 Pressure ulcer of right heel, stage 2: Secondary | ICD-10-CM | POA: Diagnosis not present

## 2016-07-31 DIAGNOSIS — N390 Urinary tract infection, site not specified: Secondary | ICD-10-CM | POA: Diagnosis not present

## 2016-07-31 DIAGNOSIS — S42202A Unspecified fracture of upper end of left humerus, initial encounter for closed fracture: Secondary | ICD-10-CM | POA: Diagnosis not present

## 2016-07-31 DIAGNOSIS — G603 Idiopathic progressive neuropathy: Secondary | ICD-10-CM | POA: Diagnosis not present

## 2016-08-05 DIAGNOSIS — L89612 Pressure ulcer of right heel, stage 2: Secondary | ICD-10-CM | POA: Diagnosis not present

## 2016-08-05 DIAGNOSIS — N390 Urinary tract infection, site not specified: Secondary | ICD-10-CM | POA: Diagnosis not present

## 2016-08-05 DIAGNOSIS — I509 Heart failure, unspecified: Secondary | ICD-10-CM | POA: Diagnosis not present

## 2016-08-05 DIAGNOSIS — I110 Hypertensive heart disease with heart failure: Secondary | ICD-10-CM | POA: Diagnosis not present

## 2016-08-05 DIAGNOSIS — G603 Idiopathic progressive neuropathy: Secondary | ICD-10-CM | POA: Diagnosis not present

## 2016-08-05 DIAGNOSIS — S42202A Unspecified fracture of upper end of left humerus, initial encounter for closed fracture: Secondary | ICD-10-CM | POA: Diagnosis not present

## 2016-08-07 DIAGNOSIS — I110 Hypertensive heart disease with heart failure: Secondary | ICD-10-CM | POA: Diagnosis not present

## 2016-08-08 DIAGNOSIS — Z6831 Body mass index (BMI) 31.0-31.9, adult: Secondary | ICD-10-CM | POA: Diagnosis not present

## 2016-08-08 DIAGNOSIS — N390 Urinary tract infection, site not specified: Secondary | ICD-10-CM | POA: Diagnosis not present

## 2016-08-08 DIAGNOSIS — L97412 Non-pressure chronic ulcer of right heel and midfoot with fat layer exposed: Secondary | ICD-10-CM | POA: Diagnosis not present

## 2016-08-11 DIAGNOSIS — I509 Heart failure, unspecified: Secondary | ICD-10-CM | POA: Diagnosis not present

## 2016-08-11 DIAGNOSIS — L89612 Pressure ulcer of right heel, stage 2: Secondary | ICD-10-CM | POA: Diagnosis not present

## 2016-08-11 DIAGNOSIS — I110 Hypertensive heart disease with heart failure: Secondary | ICD-10-CM | POA: Diagnosis not present

## 2016-08-11 DIAGNOSIS — S42202A Unspecified fracture of upper end of left humerus, initial encounter for closed fracture: Secondary | ICD-10-CM | POA: Diagnosis not present

## 2016-08-11 DIAGNOSIS — G603 Idiopathic progressive neuropathy: Secondary | ICD-10-CM | POA: Diagnosis not present

## 2016-08-11 DIAGNOSIS — N390 Urinary tract infection, site not specified: Secondary | ICD-10-CM | POA: Diagnosis not present

## 2016-08-14 ENCOUNTER — Ambulatory Visit: Payer: Medicare Other | Admitting: Sports Medicine

## 2016-08-14 DIAGNOSIS — L89612 Pressure ulcer of right heel, stage 2: Secondary | ICD-10-CM | POA: Diagnosis not present

## 2016-08-14 DIAGNOSIS — G603 Idiopathic progressive neuropathy: Secondary | ICD-10-CM | POA: Diagnosis not present

## 2016-08-14 DIAGNOSIS — S42202A Unspecified fracture of upper end of left humerus, initial encounter for closed fracture: Secondary | ICD-10-CM | POA: Diagnosis not present

## 2016-08-14 DIAGNOSIS — N390 Urinary tract infection, site not specified: Secondary | ICD-10-CM | POA: Diagnosis not present

## 2016-08-14 DIAGNOSIS — I509 Heart failure, unspecified: Secondary | ICD-10-CM | POA: Diagnosis not present

## 2016-08-14 DIAGNOSIS — I110 Hypertensive heart disease with heart failure: Secondary | ICD-10-CM | POA: Diagnosis not present

## 2016-08-18 DIAGNOSIS — L89612 Pressure ulcer of right heel, stage 2: Secondary | ICD-10-CM | POA: Diagnosis not present

## 2016-08-18 DIAGNOSIS — S42202A Unspecified fracture of upper end of left humerus, initial encounter for closed fracture: Secondary | ICD-10-CM | POA: Diagnosis not present

## 2016-08-18 DIAGNOSIS — I509 Heart failure, unspecified: Secondary | ICD-10-CM | POA: Diagnosis not present

## 2016-08-18 DIAGNOSIS — G603 Idiopathic progressive neuropathy: Secondary | ICD-10-CM | POA: Diagnosis not present

## 2016-08-18 DIAGNOSIS — N390 Urinary tract infection, site not specified: Secondary | ICD-10-CM | POA: Diagnosis not present

## 2016-08-18 DIAGNOSIS — I110 Hypertensive heart disease with heart failure: Secondary | ICD-10-CM | POA: Diagnosis not present

## 2016-08-22 DIAGNOSIS — S42202A Unspecified fracture of upper end of left humerus, initial encounter for closed fracture: Secondary | ICD-10-CM | POA: Diagnosis not present

## 2016-08-22 DIAGNOSIS — L89612 Pressure ulcer of right heel, stage 2: Secondary | ICD-10-CM | POA: Diagnosis not present

## 2016-08-22 DIAGNOSIS — I509 Heart failure, unspecified: Secondary | ICD-10-CM | POA: Diagnosis not present

## 2016-08-22 DIAGNOSIS — I110 Hypertensive heart disease with heart failure: Secondary | ICD-10-CM | POA: Diagnosis not present

## 2016-08-22 DIAGNOSIS — N390 Urinary tract infection, site not specified: Secondary | ICD-10-CM | POA: Diagnosis not present

## 2016-08-22 DIAGNOSIS — G603 Idiopathic progressive neuropathy: Secondary | ICD-10-CM | POA: Diagnosis not present

## 2016-08-25 DIAGNOSIS — I110 Hypertensive heart disease with heart failure: Secondary | ICD-10-CM | POA: Diagnosis not present

## 2016-08-25 DIAGNOSIS — G603 Idiopathic progressive neuropathy: Secondary | ICD-10-CM | POA: Diagnosis not present

## 2016-08-26 DIAGNOSIS — I509 Heart failure, unspecified: Secondary | ICD-10-CM | POA: Diagnosis not present

## 2016-08-26 DIAGNOSIS — L89620 Pressure ulcer of left heel, unstageable: Secondary | ICD-10-CM | POA: Diagnosis not present

## 2016-08-26 DIAGNOSIS — G603 Idiopathic progressive neuropathy: Secondary | ICD-10-CM | POA: Diagnosis not present

## 2016-08-26 DIAGNOSIS — L89610 Pressure ulcer of right heel, unstageable: Secondary | ICD-10-CM | POA: Diagnosis not present

## 2016-08-26 DIAGNOSIS — S42202D Unspecified fracture of upper end of left humerus, subsequent encounter for fracture with routine healing: Secondary | ICD-10-CM | POA: Diagnosis not present

## 2016-08-26 DIAGNOSIS — I110 Hypertensive heart disease with heart failure: Secondary | ICD-10-CM | POA: Diagnosis not present

## 2016-08-28 ENCOUNTER — Ambulatory Visit (INDEPENDENT_AMBULATORY_CARE_PROVIDER_SITE_OTHER): Payer: Medicare Other | Admitting: Sports Medicine

## 2016-08-28 DIAGNOSIS — M79676 Pain in unspecified toe(s): Secondary | ICD-10-CM | POA: Diagnosis not present

## 2016-08-28 DIAGNOSIS — E1140 Type 2 diabetes mellitus with diabetic neuropathy, unspecified: Secondary | ICD-10-CM

## 2016-08-28 DIAGNOSIS — B351 Tinea unguium: Secondary | ICD-10-CM | POA: Diagnosis not present

## 2016-08-28 NOTE — Progress Notes (Signed)
Patient ID: Monica Evans, female   DOB: 10-25-1934, 82 y.o.   MRN: [id number]  Subjective: Monica Evans is a 82 y.o. female patient with history of type 2 diabetes who presents to office today complaining of long, painful nails  while ambulating in shoes; unable to trim. Patient states that the glucose reading was fine. Is having surgery on her heels on Monday by general surgeon because heel ulcers are not getting better. Denies any other issues.   Patient Active Problem List   Diagnosis Date Noted  . Left displaced femoral neck fracture ([place]) 07/01/2014  . Essential hypertension 07/01/2014  . Impaired glucose tolerance 07/01/2014  . Peripheral neuropathy 07/01/2014  . DVT (deep venous thrombosis) ([place]) 10/03/2013  . Lumbar burst fracture 08/29/2013   Current Outpatient Prescriptions on File Prior to Visit  Medication Sig Dispense Refill  . ACCU-CHEK FASTCLIX LANCETS MISC See admin instructions.  12  . ACCU-CHEK SMARTVIEW test strip   3  . Calcium Carbonate-Vitamin D (CALCIUM-VITAMIN D) 500-200 MG-UNIT per tablet Take 1 tablet by mouth 2 (two) times daily.    Marland Kitchen co-enzyme Q-10 30 MG capsule Take 30 mg by mouth daily.    . fluconazole (DIFLUCAN) 200 MG tablet Take 200 mg by mouth daily.  0  . furosemide (LASIX) 40 MG tablet Take 80 mg by mouth every evening.    . gabapentin (NEURONTIN) 600 MG tablet Take 600 mg by mouth 2 (two) times daily.  0  . gabapentin (NEURONTIN) 800 MG tablet Take 800 mg by mouth 3 (three) times daily.  0  . HYDROcodone-acetaminophen (NORCO) 5-325 MG per tablet Take 1-2 tablets by mouth every 6 (six) hours as needed for moderate pain. 90 tablet 0  . levofloxacin (LEVAQUIN) 500 MG tablet Take 500 mg by mouth daily.  0  . nystatin cream (MYCOSTATIN) Apply 1 application topically daily as needed for dry skin.   12  . omeprazole (PRILOSEC) 20 MG capsule Take 20 mg by mouth daily.     . potassium chloride (MICRO-K) 10 MEQ CR capsule Take 60 mEq by mouth daily.     .  predniSONE (DELTASONE) 10 MG tablet Take 10 mg by mouth daily with breakfast.    . rivaroxaban (XARELTO) 20 MG TABS tablet Take 1 tablet (20 mg total) by mouth daily with supper. 90 tablet 0  . silver sulfADIAZINE (SILVADENE) 1 % cream Apply 1 application topically daily. (Patient not taking: Reported on 07/01/2014) 50 g 0  . timolol (TIMOPTIC) 0.5 % ophthalmic solution Place 1 drop into the left eye 2 (two) times daily.   3  . traMADol (ULTRAM) 50 MG tablet Take 50 mg by mouth See admin instructions. Take three to four times daily per patient    . verapamil (CALAN-SR) 240 MG CR tablet Take 240 mg by mouth at bedtime.     No current facility-administered medications on file prior to visit.    Allergies  Allergen Reactions  . Penicillins Swelling    Tolerated Ancef 08/29/13, tdd    Objective: General: Patient is awake, alert, and oriented x 3 and in no acute distress. Cane assisted gait   Integument: Skin is cool, dry and supple bilateral. Nails are tender, long, thickened and  dystrophic with subungual debris, consistent with onychomycosis, 1-5 bilateral. Dressing at bilateral heels that patient does not want me to remove. Remaining integument unremarkable.  Vasculature:  Dorsalis Pedis pulse 1/4 bilateral. Posterior Tibial pulse  1/4 bilateral.  Capillary fill time <4 sec 1-5 bilateral. Scant  hair growth to the level of the digits. Temperature gradient decreased bilateral. + varicosities present bilateral. No edema present bilateral.   Neurology: The patient has intact sensation measured with a 5.07/10g Semmes Weinstein Monofilament at all pedal sites bilateral . Vibratory sensation diminished bilateral with tuning fork. No Babinski sign present bilateral.   Musculoskeletal: Bunion and hammer toe deformities noted bilateral; Right hallux is most contracted with hammertoe deformity. Muscular strength 5/5 in all lower extremity muscular groups bilateral without pain or limitation on range of  motion . No tenderness with calf compression bilateral.  Assessment and Plan: Problem List Items Addressed This Visit    None    Visit Diagnoses    Pain due to onychomycosis of toenail    -  Primary   Type 2 diabetes mellitus with diabetic neuropathy, unspecified whether long term insulin use ([place])         -Examined patient. -Discussed and educated patient on diabetic foot care, especially with  regards to the vascular, neurological and musculoskeletal systems.  -Stressed the importance of good glycemic control and the detriment of not  controlling glucose levels in relation to the foot. -Mechanically debrided all nails 1-5 bilateral using sterile nail nipper and filed with dremel without incident  -Answered all patient questions -Patient to return in 3 months for at risk foot care -Patient advised to call the office if any problems or questions arise in the meantime.  Monica Evans, DPM

## 2016-08-29 DIAGNOSIS — I509 Heart failure, unspecified: Secondary | ICD-10-CM | POA: Diagnosis not present

## 2016-08-29 DIAGNOSIS — I110 Hypertensive heart disease with heart failure: Secondary | ICD-10-CM | POA: Diagnosis not present

## 2016-08-29 DIAGNOSIS — L89610 Pressure ulcer of right heel, unstageable: Secondary | ICD-10-CM | POA: Diagnosis not present

## 2016-08-29 DIAGNOSIS — G603 Idiopathic progressive neuropathy: Secondary | ICD-10-CM | POA: Diagnosis not present

## 2016-08-29 DIAGNOSIS — S42202D Unspecified fracture of upper end of left humerus, subsequent encounter for fracture with routine healing: Secondary | ICD-10-CM | POA: Diagnosis not present

## 2016-08-29 DIAGNOSIS — L89620 Pressure ulcer of left heel, unstageable: Secondary | ICD-10-CM | POA: Diagnosis not present

## 2016-09-01 DIAGNOSIS — L89613 Pressure ulcer of right heel, stage 3: Secondary | ICD-10-CM | POA: Diagnosis not present

## 2016-09-01 DIAGNOSIS — L89623 Pressure ulcer of left heel, stage 3: Secondary | ICD-10-CM | POA: Diagnosis not present

## 2016-09-01 DIAGNOSIS — L89302 Pressure ulcer of unspecified buttock, stage 2: Secondary | ICD-10-CM | POA: Diagnosis not present

## 2016-09-02 DIAGNOSIS — G603 Idiopathic progressive neuropathy: Secondary | ICD-10-CM | POA: Diagnosis not present

## 2016-09-02 DIAGNOSIS — I509 Heart failure, unspecified: Secondary | ICD-10-CM | POA: Diagnosis not present

## 2016-09-02 DIAGNOSIS — L89610 Pressure ulcer of right heel, unstageable: Secondary | ICD-10-CM | POA: Diagnosis not present

## 2016-09-02 DIAGNOSIS — L89620 Pressure ulcer of left heel, unstageable: Secondary | ICD-10-CM | POA: Diagnosis not present

## 2016-09-02 DIAGNOSIS — I110 Hypertensive heart disease with heart failure: Secondary | ICD-10-CM | POA: Diagnosis not present

## 2016-09-02 DIAGNOSIS — S42202D Unspecified fracture of upper end of left humerus, subsequent encounter for fracture with routine healing: Secondary | ICD-10-CM | POA: Diagnosis not present

## 2016-09-04 DIAGNOSIS — L89610 Pressure ulcer of right heel, unstageable: Secondary | ICD-10-CM | POA: Diagnosis not present

## 2016-09-04 DIAGNOSIS — L89620 Pressure ulcer of left heel, unstageable: Secondary | ICD-10-CM | POA: Diagnosis not present

## 2016-09-05 DIAGNOSIS — I509 Heart failure, unspecified: Secondary | ICD-10-CM | POA: Diagnosis not present

## 2016-09-05 DIAGNOSIS — S42202D Unspecified fracture of upper end of left humerus, subsequent encounter for fracture with routine healing: Secondary | ICD-10-CM | POA: Diagnosis not present

## 2016-09-05 DIAGNOSIS — L89610 Pressure ulcer of right heel, unstageable: Secondary | ICD-10-CM | POA: Diagnosis not present

## 2016-09-05 DIAGNOSIS — G603 Idiopathic progressive neuropathy: Secondary | ICD-10-CM | POA: Diagnosis not present

## 2016-09-05 DIAGNOSIS — L89620 Pressure ulcer of left heel, unstageable: Secondary | ICD-10-CM | POA: Diagnosis not present

## 2016-09-05 DIAGNOSIS — I110 Hypertensive heart disease with heart failure: Secondary | ICD-10-CM | POA: Diagnosis not present

## 2016-09-07 DIAGNOSIS — M7989 Other specified soft tissue disorders: Secondary | ICD-10-CM | POA: Diagnosis not present

## 2016-09-07 DIAGNOSIS — T148XXA Other injury of unspecified body region, initial encounter: Secondary | ICD-10-CM | POA: Diagnosis not present

## 2016-09-07 DIAGNOSIS — R600 Localized edema: Secondary | ICD-10-CM | POA: Diagnosis not present

## 2016-09-07 DIAGNOSIS — L03115 Cellulitis of right lower limb: Secondary | ICD-10-CM | POA: Diagnosis not present

## 2016-09-07 DIAGNOSIS — S81812A Laceration without foreign body, left lower leg, initial encounter: Secondary | ICD-10-CM | POA: Diagnosis not present

## 2016-09-07 DIAGNOSIS — A419 Sepsis, unspecified organism: Secondary | ICD-10-CM | POA: Diagnosis not present

## 2016-09-08 DIAGNOSIS — E119 Type 2 diabetes mellitus without complications: Secondary | ICD-10-CM | POA: Diagnosis present

## 2016-09-08 DIAGNOSIS — M7989 Other specified soft tissue disorders: Secondary | ICD-10-CM | POA: Diagnosis not present

## 2016-09-08 DIAGNOSIS — Z9181 History of falling: Secondary | ICD-10-CM | POA: Diagnosis not present

## 2016-09-08 DIAGNOSIS — L03115 Cellulitis of right lower limb: Secondary | ICD-10-CM | POA: Diagnosis present

## 2016-09-08 DIAGNOSIS — S91302D Unspecified open wound, left foot, subsequent encounter: Secondary | ICD-10-CM | POA: Diagnosis not present

## 2016-09-08 DIAGNOSIS — R600 Localized edema: Secondary | ICD-10-CM | POA: Diagnosis not present

## 2016-09-08 DIAGNOSIS — S81812A Laceration without foreign body, left lower leg, initial encounter: Secondary | ICD-10-CM | POA: Diagnosis present

## 2016-09-08 DIAGNOSIS — I509 Heart failure, unspecified: Secondary | ICD-10-CM | POA: Diagnosis not present

## 2016-09-08 DIAGNOSIS — S81811A Laceration without foreign body, right lower leg, initial encounter: Secondary | ICD-10-CM | POA: Diagnosis present

## 2016-09-08 DIAGNOSIS — Z79899 Other long term (current) drug therapy: Secondary | ICD-10-CM | POA: Diagnosis not present

## 2016-09-08 DIAGNOSIS — R5381 Other malaise: Secondary | ICD-10-CM | POA: Diagnosis present

## 2016-09-08 DIAGNOSIS — W19XXXA Unspecified fall, initial encounter: Secondary | ICD-10-CM | POA: Diagnosis not present

## 2016-09-08 DIAGNOSIS — Z7982 Long term (current) use of aspirin: Secondary | ICD-10-CM | POA: Diagnosis not present

## 2016-09-08 DIAGNOSIS — S91301D Unspecified open wound, right foot, subsequent encounter: Secondary | ICD-10-CM | POA: Diagnosis not present

## 2016-09-08 DIAGNOSIS — I10 Essential (primary) hypertension: Secondary | ICD-10-CM | POA: Diagnosis not present

## 2016-09-08 DIAGNOSIS — A419 Sepsis, unspecified organism: Secondary | ICD-10-CM | POA: Diagnosis not present

## 2016-09-08 DIAGNOSIS — L03116 Cellulitis of left lower limb: Secondary | ICD-10-CM | POA: Diagnosis not present

## 2016-09-08 DIAGNOSIS — K219 Gastro-esophageal reflux disease without esophagitis: Secondary | ICD-10-CM | POA: Diagnosis not present

## 2016-09-08 DIAGNOSIS — R269 Unspecified abnormalities of gait and mobility: Secondary | ICD-10-CM | POA: Diagnosis present

## 2016-09-10 DIAGNOSIS — L89610 Pressure ulcer of right heel, unstageable: Secondary | ICD-10-CM | POA: Diagnosis not present

## 2016-09-10 DIAGNOSIS — I509 Heart failure, unspecified: Secondary | ICD-10-CM | POA: Diagnosis not present

## 2016-09-10 DIAGNOSIS — S42202D Unspecified fracture of upper end of left humerus, subsequent encounter for fracture with routine healing: Secondary | ICD-10-CM | POA: Diagnosis not present

## 2016-09-10 DIAGNOSIS — G603 Idiopathic progressive neuropathy: Secondary | ICD-10-CM | POA: Diagnosis not present

## 2016-09-10 DIAGNOSIS — I110 Hypertensive heart disease with heart failure: Secondary | ICD-10-CM | POA: Diagnosis not present

## 2016-09-10 DIAGNOSIS — L89620 Pressure ulcer of left heel, unstageable: Secondary | ICD-10-CM | POA: Diagnosis not present

## 2016-09-12 DIAGNOSIS — J82 Pulmonary eosinophilia, not elsewhere classified: Secondary | ICD-10-CM | POA: Diagnosis not present

## 2016-09-12 DIAGNOSIS — R279 Unspecified lack of coordination: Secondary | ICD-10-CM | POA: Diagnosis not present

## 2016-09-12 DIAGNOSIS — E119 Type 2 diabetes mellitus without complications: Secondary | ICD-10-CM | POA: Diagnosis present

## 2016-09-12 DIAGNOSIS — M6281 Muscle weakness (generalized): Secondary | ICD-10-CM | POA: Diagnosis not present

## 2016-09-12 DIAGNOSIS — R0602 Shortness of breath: Secondary | ICD-10-CM | POA: Diagnosis not present

## 2016-09-12 DIAGNOSIS — Z0389 Encounter for observation for other suspected diseases and conditions ruled out: Secondary | ICD-10-CM | POA: Diagnosis not present

## 2016-09-12 DIAGNOSIS — R0902 Hypoxemia: Secondary | ICD-10-CM | POA: Diagnosis not present

## 2016-09-12 DIAGNOSIS — M1990 Unspecified osteoarthritis, unspecified site: Secondary | ICD-10-CM | POA: Diagnosis not present

## 2016-09-12 DIAGNOSIS — F329 Major depressive disorder, single episode, unspecified: Secondary | ICD-10-CM | POA: Diagnosis not present

## 2016-09-12 DIAGNOSIS — S91301A Unspecified open wound, right foot, initial encounter: Secondary | ICD-10-CM | POA: Diagnosis not present

## 2016-09-12 DIAGNOSIS — S8011XA Contusion of right lower leg, initial encounter: Secondary | ICD-10-CM | POA: Diagnosis not present

## 2016-09-12 DIAGNOSIS — Z79899 Other long term (current) drug therapy: Secondary | ICD-10-CM | POA: Diagnosis not present

## 2016-09-12 DIAGNOSIS — R600 Localized edema: Secondary | ICD-10-CM | POA: Diagnosis not present

## 2016-09-12 DIAGNOSIS — E876 Hypokalemia: Secondary | ICD-10-CM | POA: Diagnosis not present

## 2016-09-12 DIAGNOSIS — W19XXXD Unspecified fall, subsequent encounter: Secondary | ICD-10-CM | POA: Diagnosis not present

## 2016-09-12 DIAGNOSIS — L97419 Non-pressure chronic ulcer of right heel and midfoot with unspecified severity: Secondary | ICD-10-CM | POA: Diagnosis not present

## 2016-09-12 DIAGNOSIS — I5033 Acute on chronic diastolic (congestive) heart failure: Secondary | ICD-10-CM | POA: Diagnosis not present

## 2016-09-12 DIAGNOSIS — M25512 Pain in left shoulder: Secondary | ICD-10-CM | POA: Diagnosis not present

## 2016-09-12 DIAGNOSIS — B9620 Unspecified Escherichia coli [E. coli] as the cause of diseases classified elsewhere: Secondary | ICD-10-CM | POA: Diagnosis present

## 2016-09-12 DIAGNOSIS — J90 Pleural effusion, not elsewhere classified: Secondary | ICD-10-CM | POA: Diagnosis not present

## 2016-09-12 DIAGNOSIS — N390 Urinary tract infection, site not specified: Secondary | ICD-10-CM | POA: Diagnosis not present

## 2016-09-12 DIAGNOSIS — E860 Dehydration: Secondary | ICD-10-CM | POA: Diagnosis present

## 2016-09-12 DIAGNOSIS — F70 Mild intellectual disabilities: Secondary | ICD-10-CM | POA: Diagnosis present

## 2016-09-12 DIAGNOSIS — J810 Acute pulmonary edema: Secondary | ICD-10-CM | POA: Diagnosis not present

## 2016-09-12 DIAGNOSIS — D638 Anemia in other chronic diseases classified elsewhere: Secondary | ICD-10-CM | POA: Diagnosis not present

## 2016-09-12 DIAGNOSIS — R4182 Altered mental status, unspecified: Secondary | ICD-10-CM | POA: Diagnosis not present

## 2016-09-12 DIAGNOSIS — Z7982 Long term (current) use of aspirin: Secondary | ICD-10-CM | POA: Diagnosis not present

## 2016-09-12 DIAGNOSIS — Z741 Need for assistance with personal care: Secondary | ICD-10-CM | POA: Diagnosis not present

## 2016-09-12 DIAGNOSIS — T1490XA Injury, unspecified, initial encounter: Secondary | ICD-10-CM | POA: Diagnosis not present

## 2016-09-12 DIAGNOSIS — R5381 Other malaise: Secondary | ICD-10-CM | POA: Diagnosis present

## 2016-09-12 DIAGNOSIS — J690 Pneumonitis due to inhalation of food and vomit: Secondary | ICD-10-CM | POA: Diagnosis not present

## 2016-09-12 DIAGNOSIS — M79661 Pain in right lower leg: Secondary | ICD-10-CM | POA: Diagnosis not present

## 2016-09-12 DIAGNOSIS — R509 Fever, unspecified: Secondary | ICD-10-CM | POA: Diagnosis not present

## 2016-09-12 DIAGNOSIS — K219 Gastro-esophageal reflux disease without esophagitis: Secondary | ICD-10-CM | POA: Diagnosis not present

## 2016-09-12 DIAGNOSIS — I10 Essential (primary) hypertension: Secondary | ICD-10-CM | POA: Diagnosis not present

## 2016-09-12 DIAGNOSIS — S42302A Unspecified fracture of shaft of humerus, left arm, initial encounter for closed fracture: Secondary | ICD-10-CM | POA: Diagnosis present

## 2016-09-12 DIAGNOSIS — S4992XA Unspecified injury of left shoulder and upper arm, initial encounter: Secondary | ICD-10-CM | POA: Diagnosis not present

## 2016-09-12 DIAGNOSIS — F419 Anxiety disorder, unspecified: Secondary | ICD-10-CM | POA: Diagnosis not present

## 2016-09-12 DIAGNOSIS — G92 Toxic encephalopathy: Secondary | ICD-10-CM | POA: Diagnosis not present

## 2016-09-12 DIAGNOSIS — R262 Difficulty in walking, not elsewhere classified: Secondary | ICD-10-CM | POA: Diagnosis not present

## 2016-09-12 DIAGNOSIS — Z751 Person awaiting admission to adequate facility elsewhere: Secondary | ICD-10-CM | POA: Diagnosis not present

## 2016-09-12 DIAGNOSIS — R278 Other lack of coordination: Secondary | ICD-10-CM | POA: Diagnosis not present

## 2016-09-12 DIAGNOSIS — J189 Pneumonia, unspecified organism: Secondary | ICD-10-CM | POA: Diagnosis not present

## 2016-09-12 DIAGNOSIS — Z66 Do not resuscitate: Secondary | ICD-10-CM | POA: Diagnosis not present

## 2016-09-12 DIAGNOSIS — I110 Hypertensive heart disease with heart failure: Secondary | ICD-10-CM | POA: Diagnosis present

## 2016-09-12 DIAGNOSIS — K5900 Constipation, unspecified: Secondary | ICD-10-CM | POA: Diagnosis not present

## 2016-09-18 DIAGNOSIS — R278 Other lack of coordination: Secondary | ICD-10-CM | POA: Diagnosis not present

## 2016-09-18 DIAGNOSIS — I10 Essential (primary) hypertension: Secondary | ICD-10-CM | POA: Diagnosis not present

## 2016-09-18 DIAGNOSIS — E876 Hypokalemia: Secondary | ICD-10-CM | POA: Diagnosis not present

## 2016-09-18 DIAGNOSIS — M1990 Unspecified osteoarthritis, unspecified site: Secondary | ICD-10-CM | POA: Diagnosis not present

## 2016-09-18 DIAGNOSIS — I5033 Acute on chronic diastolic (congestive) heart failure: Secondary | ICD-10-CM | POA: Diagnosis not present

## 2016-09-18 DIAGNOSIS — I110 Hypertensive heart disease with heart failure: Secondary | ICD-10-CM | POA: Diagnosis present

## 2016-09-18 DIAGNOSIS — K5900 Constipation, unspecified: Secondary | ICD-10-CM | POA: Diagnosis not present

## 2016-09-18 DIAGNOSIS — Z741 Need for assistance with personal care: Secondary | ICD-10-CM | POA: Diagnosis not present

## 2016-09-18 DIAGNOSIS — R609 Edema, unspecified: Secondary | ICD-10-CM | POA: Diagnosis not present

## 2016-09-18 DIAGNOSIS — R4182 Altered mental status, unspecified: Secondary | ICD-10-CM | POA: Diagnosis not present

## 2016-09-18 DIAGNOSIS — N390 Urinary tract infection, site not specified: Secondary | ICD-10-CM | POA: Diagnosis not present

## 2016-09-18 DIAGNOSIS — J810 Acute pulmonary edema: Secondary | ICD-10-CM | POA: Diagnosis not present

## 2016-09-18 DIAGNOSIS — I4892 Unspecified atrial flutter: Secondary | ICD-10-CM | POA: Diagnosis not present

## 2016-09-18 DIAGNOSIS — J9600 Acute respiratory failure, unspecified whether with hypoxia or hypercapnia: Secondary | ICD-10-CM | POA: Diagnosis not present

## 2016-09-18 DIAGNOSIS — K219 Gastro-esophageal reflux disease without esophagitis: Secondary | ICD-10-CM | POA: Diagnosis not present

## 2016-09-18 DIAGNOSIS — W19XXXD Unspecified fall, subsequent encounter: Secondary | ICD-10-CM | POA: Diagnosis not present

## 2016-09-18 DIAGNOSIS — N3001 Acute cystitis with hematuria: Secondary | ICD-10-CM | POA: Diagnosis present

## 2016-09-18 DIAGNOSIS — Z7982 Long term (current) use of aspirin: Secondary | ICD-10-CM | POA: Diagnosis not present

## 2016-09-18 DIAGNOSIS — D638 Anemia in other chronic diseases classified elsewhere: Secondary | ICD-10-CM | POA: Diagnosis present

## 2016-09-18 DIAGNOSIS — G92 Toxic encephalopathy: Secondary | ICD-10-CM | POA: Diagnosis not present

## 2016-09-18 DIAGNOSIS — F329 Major depressive disorder, single episode, unspecified: Secondary | ICD-10-CM | POA: Diagnosis not present

## 2016-09-18 DIAGNOSIS — J82 Pulmonary eosinophilia, not elsewhere classified: Secondary | ICD-10-CM | POA: Diagnosis present

## 2016-09-18 DIAGNOSIS — Z79899 Other long term (current) drug therapy: Secondary | ICD-10-CM | POA: Diagnosis not present

## 2016-09-18 DIAGNOSIS — R0602 Shortness of breath: Secondary | ICD-10-CM | POA: Diagnosis not present

## 2016-09-18 DIAGNOSIS — B952 Enterococcus as the cause of diseases classified elsewhere: Secondary | ICD-10-CM | POA: Diagnosis present

## 2016-09-18 DIAGNOSIS — E870 Hyperosmolality and hypernatremia: Secondary | ICD-10-CM | POA: Diagnosis not present

## 2016-09-18 DIAGNOSIS — F419 Anxiety disorder, unspecified: Secondary | ICD-10-CM | POA: Diagnosis not present

## 2016-09-18 DIAGNOSIS — Z66 Do not resuscitate: Secondary | ICD-10-CM | POA: Diagnosis present

## 2016-09-18 DIAGNOSIS — L97419 Non-pressure chronic ulcer of right heel and midfoot with unspecified severity: Secondary | ICD-10-CM | POA: Diagnosis not present

## 2016-09-18 DIAGNOSIS — R262 Difficulty in walking, not elsewhere classified: Secondary | ICD-10-CM | POA: Diagnosis not present

## 2016-09-18 DIAGNOSIS — T502X5A Adverse effect of carbonic-anhydrase inhibitors, benzothiadiazides and other diuretics, initial encounter: Secondary | ICD-10-CM | POA: Diagnosis not present

## 2016-09-18 DIAGNOSIS — L89600 Pressure ulcer of unspecified heel, unstageable: Secondary | ICD-10-CM | POA: Diagnosis not present

## 2016-09-18 DIAGNOSIS — M6281 Muscle weakness (generalized): Secondary | ICD-10-CM | POA: Diagnosis not present

## 2016-09-18 DIAGNOSIS — R279 Unspecified lack of coordination: Secondary | ICD-10-CM | POA: Diagnosis not present

## 2016-09-18 DIAGNOSIS — J690 Pneumonitis due to inhalation of food and vomit: Secondary | ICD-10-CM | POA: Diagnosis not present

## 2016-09-18 DIAGNOSIS — I509 Heart failure, unspecified: Secondary | ICD-10-CM | POA: Diagnosis not present

## 2016-09-18 DIAGNOSIS — B9620 Unspecified Escherichia coli [E. coli] as the cause of diseases classified elsewhere: Secondary | ICD-10-CM | POA: Diagnosis not present

## 2016-09-18 DIAGNOSIS — R600 Localized edema: Secondary | ICD-10-CM | POA: Diagnosis not present

## 2016-09-18 DIAGNOSIS — R531 Weakness: Secondary | ICD-10-CM | POA: Diagnosis not present

## 2016-09-18 DIAGNOSIS — E119 Type 2 diabetes mellitus without complications: Secondary | ICD-10-CM | POA: Diagnosis present

## 2016-09-18 DIAGNOSIS — R5381 Other malaise: Secondary | ICD-10-CM | POA: Diagnosis present

## 2016-09-18 DIAGNOSIS — J189 Pneumonia, unspecified organism: Secondary | ICD-10-CM | POA: Diagnosis not present

## 2016-09-19 DIAGNOSIS — L89600 Pressure ulcer of unspecified heel, unstageable: Secondary | ICD-10-CM | POA: Diagnosis not present

## 2016-09-19 DIAGNOSIS — R4182 Altered mental status, unspecified: Secondary | ICD-10-CM | POA: Diagnosis not present

## 2016-09-19 DIAGNOSIS — N390 Urinary tract infection, site not specified: Secondary | ICD-10-CM | POA: Diagnosis not present

## 2016-09-19 DIAGNOSIS — J690 Pneumonitis due to inhalation of food and vomit: Secondary | ICD-10-CM | POA: Diagnosis not present

## 2016-09-24 DIAGNOSIS — J810 Acute pulmonary edema: Secondary | ICD-10-CM | POA: Diagnosis not present

## 2016-09-24 DIAGNOSIS — G92 Toxic encephalopathy: Secondary | ICD-10-CM | POA: Diagnosis not present

## 2016-09-24 DIAGNOSIS — L97419 Non-pressure chronic ulcer of right heel and midfoot with unspecified severity: Secondary | ICD-10-CM | POA: Diagnosis not present

## 2016-09-24 DIAGNOSIS — Z741 Need for assistance with personal care: Secondary | ICD-10-CM | POA: Diagnosis not present

## 2016-09-24 DIAGNOSIS — E870 Hyperosmolality and hypernatremia: Secondary | ICD-10-CM | POA: Diagnosis not present

## 2016-09-24 DIAGNOSIS — R5381 Other malaise: Secondary | ICD-10-CM | POA: Diagnosis present

## 2016-09-24 DIAGNOSIS — R4182 Altered mental status, unspecified: Secondary | ICD-10-CM | POA: Diagnosis not present

## 2016-09-24 DIAGNOSIS — Z66 Do not resuscitate: Secondary | ICD-10-CM | POA: Diagnosis present

## 2016-09-24 DIAGNOSIS — Z658 Other specified problems related to psychosocial circumstances: Secondary | ICD-10-CM | POA: Diagnosis not present

## 2016-09-24 DIAGNOSIS — B9620 Unspecified Escherichia coli [E. coli] as the cause of diseases classified elsewhere: Secondary | ICD-10-CM | POA: Diagnosis not present

## 2016-09-24 DIAGNOSIS — B952 Enterococcus as the cause of diseases classified elsewhere: Secondary | ICD-10-CM | POA: Diagnosis present

## 2016-09-24 DIAGNOSIS — R278 Other lack of coordination: Secondary | ICD-10-CM | POA: Diagnosis not present

## 2016-09-24 DIAGNOSIS — Z79899 Other long term (current) drug therapy: Secondary | ICD-10-CM | POA: Diagnosis not present

## 2016-09-24 DIAGNOSIS — R262 Difficulty in walking, not elsewhere classified: Secondary | ICD-10-CM | POA: Diagnosis not present

## 2016-09-24 DIAGNOSIS — K5900 Constipation, unspecified: Secondary | ICD-10-CM | POA: Diagnosis not present

## 2016-09-24 DIAGNOSIS — M6281 Muscle weakness (generalized): Secondary | ICD-10-CM | POA: Diagnosis not present

## 2016-09-24 DIAGNOSIS — N390 Urinary tract infection, site not specified: Secondary | ICD-10-CM | POA: Diagnosis not present

## 2016-09-24 DIAGNOSIS — J9600 Acute respiratory failure, unspecified whether with hypoxia or hypercapnia: Secondary | ICD-10-CM | POA: Diagnosis not present

## 2016-09-24 DIAGNOSIS — R531 Weakness: Secondary | ICD-10-CM | POA: Diagnosis not present

## 2016-09-24 DIAGNOSIS — N3001 Acute cystitis with hematuria: Secondary | ICD-10-CM | POA: Diagnosis present

## 2016-09-24 DIAGNOSIS — I5033 Acute on chronic diastolic (congestive) heart failure: Secondary | ICD-10-CM | POA: Diagnosis not present

## 2016-09-24 DIAGNOSIS — E876 Hypokalemia: Secondary | ICD-10-CM | POA: Diagnosis not present

## 2016-09-24 DIAGNOSIS — I4892 Unspecified atrial flutter: Secondary | ICD-10-CM | POA: Diagnosis not present

## 2016-09-24 DIAGNOSIS — I10 Essential (primary) hypertension: Secondary | ICD-10-CM | POA: Diagnosis not present

## 2016-09-24 DIAGNOSIS — W19XXXD Unspecified fall, subsequent encounter: Secondary | ICD-10-CM | POA: Diagnosis not present

## 2016-09-24 DIAGNOSIS — R0602 Shortness of breath: Secondary | ICD-10-CM | POA: Diagnosis not present

## 2016-09-24 DIAGNOSIS — F329 Major depressive disorder, single episode, unspecified: Secondary | ICD-10-CM | POA: Diagnosis not present

## 2016-09-24 DIAGNOSIS — T502X5A Adverse effect of carbonic-anhydrase inhibitors, benzothiadiazides and other diuretics, initial encounter: Secondary | ICD-10-CM | POA: Diagnosis not present

## 2016-09-24 DIAGNOSIS — R609 Edema, unspecified: Secondary | ICD-10-CM | POA: Diagnosis not present

## 2016-09-24 DIAGNOSIS — R279 Unspecified lack of coordination: Secondary | ICD-10-CM | POA: Diagnosis not present

## 2016-09-24 DIAGNOSIS — Z7982 Long term (current) use of aspirin: Secondary | ICD-10-CM | POA: Diagnosis not present

## 2016-09-24 DIAGNOSIS — E119 Type 2 diabetes mellitus without complications: Secondary | ICD-10-CM | POA: Diagnosis present

## 2016-09-24 DIAGNOSIS — I110 Hypertensive heart disease with heart failure: Secondary | ICD-10-CM | POA: Diagnosis not present

## 2016-09-24 DIAGNOSIS — I509 Heart failure, unspecified: Secondary | ICD-10-CM | POA: Diagnosis not present

## 2016-09-24 DIAGNOSIS — K219 Gastro-esophageal reflux disease without esophagitis: Secondary | ICD-10-CM | POA: Diagnosis present

## 2016-09-24 DIAGNOSIS — J690 Pneumonitis due to inhalation of food and vomit: Secondary | ICD-10-CM | POA: Diagnosis not present

## 2016-09-24 DIAGNOSIS — I5040 Unspecified combined systolic (congestive) and diastolic (congestive) heart failure: Secondary | ICD-10-CM | POA: Diagnosis not present

## 2016-09-24 DIAGNOSIS — D638 Anemia in other chronic diseases classified elsewhere: Secondary | ICD-10-CM | POA: Diagnosis present

## 2016-09-24 DIAGNOSIS — J82 Pulmonary eosinophilia, not elsewhere classified: Secondary | ICD-10-CM | POA: Diagnosis not present

## 2016-09-24 DIAGNOSIS — J189 Pneumonia, unspecified organism: Secondary | ICD-10-CM | POA: Diagnosis not present

## 2016-09-24 DIAGNOSIS — F419 Anxiety disorder, unspecified: Secondary | ICD-10-CM | POA: Diagnosis not present

## 2016-09-24 DIAGNOSIS — R600 Localized edema: Secondary | ICD-10-CM | POA: Diagnosis not present

## 2016-09-25 DIAGNOSIS — R600 Localized edema: Secondary | ICD-10-CM

## 2016-09-25 DIAGNOSIS — J810 Acute pulmonary edema: Secondary | ICD-10-CM

## 2016-09-30 DIAGNOSIS — K219 Gastro-esophageal reflux disease without esophagitis: Secondary | ICD-10-CM | POA: Diagnosis not present

## 2016-09-30 DIAGNOSIS — L97419 Non-pressure chronic ulcer of right heel and midfoot with unspecified severity: Secondary | ICD-10-CM | POA: Diagnosis not present

## 2016-09-30 DIAGNOSIS — W19XXXD Unspecified fall, subsequent encounter: Secondary | ICD-10-CM | POA: Diagnosis not present

## 2016-09-30 DIAGNOSIS — E876 Hypokalemia: Secondary | ICD-10-CM | POA: Diagnosis not present

## 2016-09-30 DIAGNOSIS — K5900 Constipation, unspecified: Secondary | ICD-10-CM | POA: Diagnosis not present

## 2016-09-30 DIAGNOSIS — R600 Localized edema: Secondary | ICD-10-CM | POA: Diagnosis not present

## 2016-09-30 DIAGNOSIS — J80 Acute respiratory distress syndrome: Secondary | ICD-10-CM | POA: Diagnosis not present

## 2016-09-30 DIAGNOSIS — I4892 Unspecified atrial flutter: Secondary | ICD-10-CM | POA: Diagnosis not present

## 2016-09-30 DIAGNOSIS — J90 Pleural effusion, not elsewhere classified: Secondary | ICD-10-CM | POA: Diagnosis not present

## 2016-09-30 DIAGNOSIS — D72829 Elevated white blood cell count, unspecified: Secondary | ICD-10-CM | POA: Diagnosis not present

## 2016-09-30 DIAGNOSIS — J690 Pneumonitis due to inhalation of food and vomit: Secondary | ICD-10-CM | POA: Diagnosis not present

## 2016-09-30 DIAGNOSIS — R630 Anorexia: Secondary | ICD-10-CM | POA: Diagnosis not present

## 2016-09-30 DIAGNOSIS — J82 Pulmonary eosinophilia, not elsewhere classified: Secondary | ICD-10-CM | POA: Diagnosis not present

## 2016-09-30 DIAGNOSIS — R4182 Altered mental status, unspecified: Secondary | ICD-10-CM | POA: Diagnosis not present

## 2016-09-30 DIAGNOSIS — R262 Difficulty in walking, not elsewhere classified: Secondary | ICD-10-CM | POA: Diagnosis not present

## 2016-09-30 DIAGNOSIS — Z741 Need for assistance with personal care: Secondary | ICD-10-CM | POA: Diagnosis not present

## 2016-09-30 DIAGNOSIS — I5033 Acute on chronic diastolic (congestive) heart failure: Secondary | ICD-10-CM | POA: Diagnosis not present

## 2016-09-30 DIAGNOSIS — G92 Toxic encephalopathy: Secondary | ICD-10-CM | POA: Diagnosis not present

## 2016-09-30 DIAGNOSIS — B9620 Unspecified Escherichia coli [E. coli] as the cause of diseases classified elsewhere: Secondary | ICD-10-CM | POA: Diagnosis not present

## 2016-09-30 DIAGNOSIS — F419 Anxiety disorder, unspecified: Secondary | ICD-10-CM | POA: Diagnosis not present

## 2016-09-30 DIAGNOSIS — D638 Anemia in other chronic diseases classified elsewhere: Secondary | ICD-10-CM | POA: Diagnosis not present

## 2016-09-30 DIAGNOSIS — R278 Other lack of coordination: Secondary | ICD-10-CM | POA: Diagnosis not present

## 2016-09-30 DIAGNOSIS — J810 Acute pulmonary edema: Secondary | ICD-10-CM | POA: Diagnosis not present

## 2016-09-30 DIAGNOSIS — E119 Type 2 diabetes mellitus without complications: Secondary | ICD-10-CM | POA: Diagnosis not present

## 2016-09-30 DIAGNOSIS — M6281 Muscle weakness (generalized): Secondary | ICD-10-CM | POA: Diagnosis not present

## 2016-09-30 DIAGNOSIS — I10 Essential (primary) hypertension: Secondary | ICD-10-CM | POA: Diagnosis not present

## 2016-09-30 DIAGNOSIS — F0390 Unspecified dementia without behavioral disturbance: Secondary | ICD-10-CM | POA: Diagnosis not present

## 2016-09-30 DIAGNOSIS — N390 Urinary tract infection, site not specified: Secondary | ICD-10-CM | POA: Diagnosis not present

## 2016-09-30 DIAGNOSIS — R279 Unspecified lack of coordination: Secondary | ICD-10-CM | POA: Diagnosis not present

## 2016-09-30 DIAGNOSIS — Z658 Other specified problems related to psychosocial circumstances: Secondary | ICD-10-CM | POA: Diagnosis not present

## 2016-09-30 DIAGNOSIS — I517 Cardiomegaly: Secondary | ICD-10-CM | POA: Diagnosis not present

## 2016-09-30 DIAGNOSIS — F329 Major depressive disorder, single episode, unspecified: Secondary | ICD-10-CM | POA: Diagnosis not present

## 2016-09-30 DIAGNOSIS — G9341 Metabolic encephalopathy: Secondary | ICD-10-CM | POA: Diagnosis not present

## 2016-09-30 DIAGNOSIS — R531 Weakness: Secondary | ICD-10-CM | POA: Diagnosis not present

## 2016-09-30 DIAGNOSIS — K5720 Diverticulitis of large intestine with perforation and abscess without bleeding: Secondary | ICD-10-CM | POA: Diagnosis not present

## 2016-09-30 DIAGNOSIS — E10649 Type 1 diabetes mellitus with hypoglycemia without coma: Secondary | ICD-10-CM | POA: Diagnosis not present

## 2016-09-30 DIAGNOSIS — J189 Pneumonia, unspecified organism: Secondary | ICD-10-CM | POA: Diagnosis not present

## 2016-09-30 DIAGNOSIS — I509 Heart failure, unspecified: Secondary | ICD-10-CM | POA: Diagnosis not present

## 2016-09-30 DIAGNOSIS — I5040 Unspecified combined systolic (congestive) and diastolic (congestive) heart failure: Secondary | ICD-10-CM | POA: Diagnosis not present

## 2016-10-01 DIAGNOSIS — N390 Urinary tract infection, site not specified: Secondary | ICD-10-CM | POA: Diagnosis not present

## 2016-10-01 DIAGNOSIS — I5033 Acute on chronic diastolic (congestive) heart failure: Secondary | ICD-10-CM | POA: Diagnosis not present

## 2016-10-01 DIAGNOSIS — G9341 Metabolic encephalopathy: Secondary | ICD-10-CM | POA: Diagnosis not present

## 2016-10-01 DIAGNOSIS — J82 Pulmonary eosinophilia, not elsewhere classified: Secondary | ICD-10-CM | POA: Diagnosis not present

## 2016-10-03 DIAGNOSIS — D72829 Elevated white blood cell count, unspecified: Secondary | ICD-10-CM | POA: Diagnosis not present

## 2016-10-06 DIAGNOSIS — R4182 Altered mental status, unspecified: Secondary | ICD-10-CM | POA: Diagnosis not present

## 2016-10-06 DIAGNOSIS — N390 Urinary tract infection, site not specified: Secondary | ICD-10-CM | POA: Diagnosis not present

## 2016-10-06 DIAGNOSIS — D72829 Elevated white blood cell count, unspecified: Secondary | ICD-10-CM | POA: Diagnosis not present

## 2016-10-06 DIAGNOSIS — R630 Anorexia: Secondary | ICD-10-CM | POA: Diagnosis not present

## 2016-10-07 DIAGNOSIS — I517 Cardiomegaly: Secondary | ICD-10-CM | POA: Diagnosis not present

## 2016-10-07 DIAGNOSIS — R109 Unspecified abdominal pain: Secondary | ICD-10-CM | POA: Diagnosis not present

## 2016-10-07 DIAGNOSIS — J90 Pleural effusion, not elsewhere classified: Secondary | ICD-10-CM | POA: Diagnosis not present

## 2016-10-07 DIAGNOSIS — R4182 Altered mental status, unspecified: Secondary | ICD-10-CM | POA: Diagnosis not present

## 2016-10-07 DIAGNOSIS — F0390 Unspecified dementia without behavioral disturbance: Secondary | ICD-10-CM | POA: Diagnosis not present

## 2016-10-07 DIAGNOSIS — K5720 Diverticulitis of large intestine with perforation and abscess without bleeding: Secondary | ICD-10-CM | POA: Diagnosis not present

## 2016-10-08 DIAGNOSIS — I110 Hypertensive heart disease with heart failure: Secondary | ICD-10-CM | POA: Diagnosis present

## 2016-10-08 DIAGNOSIS — D649 Anemia, unspecified: Secondary | ICD-10-CM | POA: Diagnosis not present

## 2016-10-08 DIAGNOSIS — K651 Peritoneal abscess: Secondary | ICD-10-CM | POA: Diagnosis not present

## 2016-10-08 DIAGNOSIS — T380X5A Adverse effect of glucocorticoids and synthetic analogues, initial encounter: Secondary | ICD-10-CM | POA: Diagnosis present

## 2016-10-08 DIAGNOSIS — Z515 Encounter for palliative care: Secondary | ICD-10-CM | POA: Diagnosis not present

## 2016-10-08 DIAGNOSIS — Z7952 Long term (current) use of systemic steroids: Secondary | ICD-10-CM | POA: Diagnosis not present

## 2016-10-08 DIAGNOSIS — I4892 Unspecified atrial flutter: Secondary | ICD-10-CM | POA: Diagnosis not present

## 2016-10-08 DIAGNOSIS — R739 Hyperglycemia, unspecified: Secondary | ICD-10-CM | POA: Diagnosis present

## 2016-10-08 DIAGNOSIS — N390 Urinary tract infection, site not specified: Secondary | ICD-10-CM | POA: Diagnosis not present

## 2016-10-08 DIAGNOSIS — I517 Cardiomegaly: Secondary | ICD-10-CM | POA: Diagnosis not present

## 2016-10-08 DIAGNOSIS — R6520 Severe sepsis without septic shock: Secondary | ICD-10-CM | POA: Diagnosis present

## 2016-10-08 DIAGNOSIS — R0602 Shortness of breath: Secondary | ICD-10-CM | POA: Diagnosis not present

## 2016-10-08 DIAGNOSIS — Z7982 Long term (current) use of aspirin: Secondary | ICD-10-CM | POA: Diagnosis not present

## 2016-10-08 DIAGNOSIS — J90 Pleural effusion, not elsewhere classified: Secondary | ICD-10-CM | POA: Diagnosis not present

## 2016-10-08 DIAGNOSIS — Z79899 Other long term (current) drug therapy: Secondary | ICD-10-CM | POA: Diagnosis not present

## 2016-10-08 DIAGNOSIS — I509 Heart failure, unspecified: Secondary | ICD-10-CM | POA: Diagnosis not present

## 2016-10-08 DIAGNOSIS — B952 Enterococcus as the cause of diseases classified elsewhere: Secondary | ICD-10-CM | POA: Diagnosis present

## 2016-10-08 DIAGNOSIS — Z8659 Personal history of other mental and behavioral disorders: Secondary | ICD-10-CM | POA: Diagnosis not present

## 2016-10-08 DIAGNOSIS — R4182 Altered mental status, unspecified: Secondary | ICD-10-CM | POA: Diagnosis not present

## 2016-10-08 DIAGNOSIS — I10 Essential (primary) hypertension: Secondary | ICD-10-CM | POA: Diagnosis not present

## 2016-10-08 DIAGNOSIS — N3000 Acute cystitis without hematuria: Secondary | ICD-10-CM | POA: Diagnosis not present

## 2016-10-08 DIAGNOSIS — J82 Pulmonary eosinophilia, not elsewhere classified: Secondary | ICD-10-CM | POA: Diagnosis not present

## 2016-10-08 DIAGNOSIS — F0390 Unspecified dementia without behavioral disturbance: Secondary | ICD-10-CM | POA: Diagnosis not present

## 2016-10-08 DIAGNOSIS — Z87440 Personal history of urinary (tract) infections: Secondary | ICD-10-CM | POA: Diagnosis not present

## 2016-10-08 DIAGNOSIS — Z8679 Personal history of other diseases of the circulatory system: Secondary | ICD-10-CM | POA: Diagnosis not present

## 2016-10-08 DIAGNOSIS — Z8639 Personal history of other endocrine, nutritional and metabolic disease: Secondary | ICD-10-CM | POA: Diagnosis not present

## 2016-10-08 DIAGNOSIS — A419 Sepsis, unspecified organism: Secondary | ICD-10-CM | POA: Diagnosis present

## 2016-10-08 DIAGNOSIS — J9611 Chronic respiratory failure with hypoxia: Secondary | ICD-10-CM | POA: Diagnosis present

## 2016-10-08 DIAGNOSIS — D638 Anemia in other chronic diseases classified elsewhere: Secondary | ICD-10-CM | POA: Diagnosis present

## 2016-10-08 DIAGNOSIS — J9610 Chronic respiratory failure, unspecified whether with hypoxia or hypercapnia: Secondary | ICD-10-CM | POA: Diagnosis not present

## 2016-10-08 DIAGNOSIS — Z8614 Personal history of Methicillin resistant Staphylococcus aureus infection: Secondary | ICD-10-CM | POA: Diagnosis not present

## 2016-10-08 DIAGNOSIS — G92 Toxic encephalopathy: Secondary | ICD-10-CM | POA: Diagnosis not present

## 2016-10-08 DIAGNOSIS — Z8719 Personal history of other diseases of the digestive system: Secondary | ICD-10-CM | POA: Diagnosis not present

## 2016-10-08 DIAGNOSIS — R279 Unspecified lack of coordination: Secondary | ICD-10-CM | POA: Diagnosis not present

## 2016-10-08 DIAGNOSIS — R109 Unspecified abdominal pain: Secondary | ICD-10-CM | POA: Diagnosis not present

## 2016-10-08 DIAGNOSIS — K219 Gastro-esophageal reflux disease without esophagitis: Secondary | ICD-10-CM | POA: Diagnosis present

## 2016-10-08 DIAGNOSIS — Z66 Do not resuscitate: Secondary | ICD-10-CM | POA: Diagnosis present

## 2016-10-08 DIAGNOSIS — K5720 Diverticulitis of large intestine with perforation and abscess without bleeding: Secondary | ICD-10-CM | POA: Diagnosis not present

## 2016-10-08 DIAGNOSIS — Z92241 Personal history of systemic steroid therapy: Secondary | ICD-10-CM | POA: Diagnosis not present

## 2016-10-08 DIAGNOSIS — I5033 Acute on chronic diastolic (congestive) heart failure: Secondary | ICD-10-CM | POA: Diagnosis not present

## 2016-10-08 DIAGNOSIS — Z7401 Bed confinement status: Secondary | ICD-10-CM | POA: Diagnosis not present

## 2016-10-08 DIAGNOSIS — Z8701 Personal history of pneumonia (recurrent): Secondary | ICD-10-CM | POA: Diagnosis not present

## 2016-10-08 DIAGNOSIS — E876 Hypokalemia: Secondary | ICD-10-CM | POA: Diagnosis present

## 2016-10-14 DIAGNOSIS — I4892 Unspecified atrial flutter: Secondary | ICD-10-CM | POA: Diagnosis not present

## 2016-10-14 DIAGNOSIS — J9610 Chronic respiratory failure, unspecified whether with hypoxia or hypercapnia: Secondary | ICD-10-CM | POA: Diagnosis not present

## 2016-10-14 DIAGNOSIS — J82 Pulmonary eosinophilia, not elsewhere classified: Secondary | ICD-10-CM | POA: Diagnosis not present

## 2016-10-14 DIAGNOSIS — K5720 Diverticulitis of large intestine with perforation and abscess without bleeding: Secondary | ICD-10-CM | POA: Diagnosis not present

## 2016-10-14 DIAGNOSIS — G92 Toxic encephalopathy: Secondary | ICD-10-CM | POA: Diagnosis not present

## 2016-10-14 DIAGNOSIS — N390 Urinary tract infection, site not specified: Secondary | ICD-10-CM | POA: Diagnosis not present

## 2016-10-14 DIAGNOSIS — I509 Heart failure, unspecified: Secondary | ICD-10-CM | POA: Diagnosis not present

## 2016-10-14 DIAGNOSIS — D649 Anemia, unspecified: Secondary | ICD-10-CM | POA: Diagnosis not present

## 2016-10-14 DIAGNOSIS — I10 Essential (primary) hypertension: Secondary | ICD-10-CM | POA: Diagnosis not present

## 2016-11-08 DEATH — deceased

## 2016-12-03 ENCOUNTER — Ambulatory Visit: Payer: Medicare Other | Admitting: Sports Medicine
